# Patient Record
Sex: Male | Born: 1945 | Race: White | Hispanic: No | Marital: Married | Smoking: Former smoker
Health system: Southern US, Community
[De-identification: ages and names within clinical notes are randomized; demographics above are authoritative.]

## PROBLEM LIST (undated history)

## (undated) DIAGNOSIS — E11628 Type 2 diabetes mellitus with other skin complications: Secondary | ICD-10-CM

## (undated) DIAGNOSIS — I2510 Atherosclerotic heart disease of native coronary artery without angina pectoris: Secondary | ICD-10-CM

## (undated) DIAGNOSIS — M1990 Unspecified osteoarthritis, unspecified site: Secondary | ICD-10-CM

## (undated) DIAGNOSIS — R51 Headache: Secondary | ICD-10-CM

## (undated) DIAGNOSIS — J189 Pneumonia, unspecified organism: Secondary | ICD-10-CM

## (undated) DIAGNOSIS — L089 Local infection of the skin and subcutaneous tissue, unspecified: Secondary | ICD-10-CM

## (undated) DIAGNOSIS — E538 Deficiency of other specified B group vitamins: Secondary | ICD-10-CM

## (undated) DIAGNOSIS — E559 Vitamin D deficiency, unspecified: Secondary | ICD-10-CM

## (undated) DIAGNOSIS — Z972 Presence of dental prosthetic device (complete) (partial): Secondary | ICD-10-CM

## (undated) DIAGNOSIS — J302 Other seasonal allergic rhinitis: Secondary | ICD-10-CM

## (undated) DIAGNOSIS — I480 Paroxysmal atrial fibrillation: Secondary | ICD-10-CM

## (undated) DIAGNOSIS — E782 Mixed hyperlipidemia: Secondary | ICD-10-CM

## (undated) DIAGNOSIS — K219 Gastro-esophageal reflux disease without esophagitis: Secondary | ICD-10-CM

## (undated) DIAGNOSIS — N400 Enlarged prostate without lower urinary tract symptoms: Secondary | ICD-10-CM

## (undated) DIAGNOSIS — I10 Essential (primary) hypertension: Secondary | ICD-10-CM

## (undated) DIAGNOSIS — E785 Hyperlipidemia, unspecified: Secondary | ICD-10-CM

## (undated) DIAGNOSIS — M545 Low back pain, unspecified: Secondary | ICD-10-CM

## (undated) DIAGNOSIS — M5440 Lumbago with sciatica, unspecified side: Secondary | ICD-10-CM

## (undated) DIAGNOSIS — N1831 Chronic kidney disease, stage 3a: Secondary | ICD-10-CM

## (undated) DIAGNOSIS — I25118 Atherosclerotic heart disease of native coronary artery with other forms of angina pectoris: Secondary | ICD-10-CM

## (undated) DIAGNOSIS — R42 Dizziness and giddiness: Secondary | ICD-10-CM

## (undated) HISTORY — DX: Type 2 diabetes mellitus with other skin complications: L08.9

## (undated) HISTORY — DX: Lumbago with sciatica, unspecified side: M54.40

## (undated) HISTORY — DX: Atherosclerotic heart disease of native coronary artery without angina pectoris: I25.10

## (undated) HISTORY — PX: ABLATION: SHX5711

## (undated) HISTORY — DX: Type 2 diabetes mellitus with other skin complications: E11.628

## (undated) HISTORY — DX: Vitamin D deficiency, unspecified: E55.9

## (undated) HISTORY — DX: Local infection of the skin and subcutaneous tissue, unspecified: E11.628

## (undated) HISTORY — DX: Paroxysmal atrial fibrillation: I48.0

## (undated) HISTORY — DX: Deficiency of other specified B group vitamins: E53.8

## (undated) HISTORY — DX: Mixed hyperlipidemia: E78.2

## (undated) HISTORY — DX: Chronic kidney disease, stage 3a: N18.31

## (undated) HISTORY — DX: Gastro-esophageal reflux disease without esophagitis: K21.9

## (undated) HISTORY — DX: Essential (primary) hypertension: I10

## (undated) HISTORY — DX: Atherosclerotic heart disease of native coronary artery with other forms of angina pectoris: I25.118

## (undated) HISTORY — PX: COLONOSCOPY: SHX174

## (undated) HISTORY — PX: OTHER SURGICAL HISTORY: SHX169

## (undated) HISTORY — PX: HAND SURGERY: SHX662

---

## 2004-11-23 ENCOUNTER — Emergency Department: Payer: Self-pay | Admitting: Emergency Medicine

## 2004-11-24 ENCOUNTER — Emergency Department: Payer: Self-pay | Admitting: General Practice

## 2005-07-29 ENCOUNTER — Ambulatory Visit: Payer: Self-pay | Admitting: Family Medicine

## 2005-08-12 ENCOUNTER — Ambulatory Visit: Payer: Self-pay | Admitting: Family Medicine

## 2006-10-06 ENCOUNTER — Ambulatory Visit: Payer: Self-pay | Admitting: Family Medicine

## 2007-05-17 ENCOUNTER — Emergency Department: Payer: Self-pay | Admitting: Emergency Medicine

## 2007-07-24 ENCOUNTER — Emergency Department: Payer: Self-pay | Admitting: Emergency Medicine

## 2009-03-27 ENCOUNTER — Emergency Department (HOSPITAL_COMMUNITY): Admission: EM | Admit: 2009-03-27 | Discharge: 2009-03-27 | Payer: Self-pay | Admitting: Emergency Medicine

## 2009-09-19 ENCOUNTER — Emergency Department (HOSPITAL_COMMUNITY): Admission: EM | Admit: 2009-09-19 | Discharge: 2009-09-19 | Payer: Self-pay | Admitting: Emergency Medicine

## 2010-01-25 ENCOUNTER — Ambulatory Visit: Payer: Self-pay

## 2010-12-11 ENCOUNTER — Encounter: Payer: Self-pay | Admitting: Neurosurgery

## 2012-06-09 ENCOUNTER — Other Ambulatory Visit: Payer: Self-pay | Admitting: Neurosurgery

## 2012-06-09 ENCOUNTER — Other Ambulatory Visit (HOSPITAL_COMMUNITY): Payer: Self-pay | Admitting: Neurosurgery

## 2012-06-09 DIAGNOSIS — M545 Low back pain, unspecified: Secondary | ICD-10-CM

## 2012-06-09 DIAGNOSIS — M542 Cervicalgia: Secondary | ICD-10-CM

## 2012-07-02 ENCOUNTER — Ambulatory Visit (HOSPITAL_COMMUNITY)
Admission: RE | Admit: 2012-07-02 | Discharge: 2012-07-02 | Disposition: A | Discharge status: Home / Self Care | Payer: Medicaid Other | Source: Ambulatory Visit | Attending: Neurosurgery | Admitting: Neurosurgery

## 2012-07-02 DIAGNOSIS — M542 Cervicalgia: Secondary | ICD-10-CM

## 2012-07-02 DIAGNOSIS — R209 Unspecified disturbances of skin sensation: Secondary | ICD-10-CM | POA: Insufficient documentation

## 2012-07-02 DIAGNOSIS — M545 Low back pain, unspecified: Secondary | ICD-10-CM | POA: Insufficient documentation

## 2012-07-02 DIAGNOSIS — M79609 Pain in unspecified limb: Secondary | ICD-10-CM | POA: Insufficient documentation

## 2012-07-02 DIAGNOSIS — M4802 Spinal stenosis, cervical region: Secondary | ICD-10-CM | POA: Insufficient documentation

## 2012-07-02 DIAGNOSIS — M48061 Spinal stenosis, lumbar region without neurogenic claudication: Secondary | ICD-10-CM | POA: Insufficient documentation

## 2012-07-02 MED ORDER — OXYCODONE-ACETAMINOPHEN 5-325 MG PO TABS
ORAL_TABLET | ORAL | Status: AC
  Administered 2012-07-02: 1 via ORAL
  Filled 2012-07-02: qty 1

## 2012-07-02 MED ORDER — DIAZEPAM 5 MG PO TABS
ORAL_TABLET | ORAL | Status: AC
  Filled 2012-07-02: qty 2

## 2012-07-02 MED ORDER — IOHEXOL 300 MG/ML  SOLN
10.0000 mL | Freq: Once | INTRAMUSCULAR | Status: AC | PRN
  Administered 2012-07-02: 10 mL via INTRATHECAL

## 2012-07-02 MED ORDER — DIAZEPAM 5 MG PO TABS
10.0000 mg | ORAL_TABLET | Freq: Once | ORAL | Status: AC
  Administered 2012-07-02: 10 mg via ORAL

## 2012-07-02 MED ORDER — OXYCODONE-ACETAMINOPHEN 5-325 MG PO TABS
1.0000 | ORAL_TABLET | ORAL | Status: DC | PRN
  Administered 2012-07-02 (×2): 1 via ORAL
  Filled 2012-07-02: qty 1

## 2012-07-02 MED ORDER — ONDANSETRON HCL 4 MG/2ML IJ SOLN: 4.0000 mg | Freq: Four times a day (QID) | INTRAMUSCULAR | Status: DC | PRN

## 2012-07-02 NOTE — Procedures (Signed)
Lumbar Puncture Procedure Note  Indications: neck and bAck pain  Procedure Details   Consent: Informed consent was obtained. Risks of the procedure were discussed including: infection, bleeding, and pain.   Under sterile conditions the patient was positioned. Betadine solution and sterile drapes were utilized. Anesthesia used included lidocaine. A 20G spinal needle was inserted at the L3 - L4 interspace. A total of 1 attempt(s) were made. I infiltrated 10cc omnipaque 300 into the thecal sac. Fluoroscopy showed good filling of the lumbar thecal sac.  Complications:  None; patient tolerated the procedure well.        Condition: stable  Plan Pressure dressing. Close observation. CT Cervical and Lumbar spine

## 2012-12-15 ENCOUNTER — Other Ambulatory Visit: Payer: Self-pay | Admitting: Neurosurgery

## 2013-01-07 ENCOUNTER — Encounter (HOSPITAL_COMMUNITY)
Admission: RE | Admit: 2013-01-07 | Discharge: 2013-01-07 | Disposition: A | Discharge status: Home / Self Care | Payer: Medicaid Other | Source: Ambulatory Visit | Attending: Neurosurgery | Admitting: Neurosurgery

## 2013-01-07 ENCOUNTER — Encounter (HOSPITAL_COMMUNITY): Payer: Self-pay | Admitting: *Deleted

## 2013-01-07 NOTE — Progress Notes (Signed)
Pt doesn't have a cardiologist   Denies ever having an echo/stress test/heart cath  Medical Md @ [facility]  Denies EKG or CXR being done within past yr

## 2013-01-07 NOTE — Pre-Procedure Instructions (Signed)
LANDRUM CARBONELL  01/07/2013   Your procedure is scheduled on:  Wed, Mar 5 @ 8:30 AM  Report to [facility] at 6:30 AM.  Call this number if you have problems the morning of surgery: [phone]   Remember:   Do not eat food or drink liquids after midnight.   Take these medicines the morning of surgery with A SIP OF WATER: Flomax(Tamsulosin) and Tramadol(Ultram)               Stop taking your Aspirin,Etodolac,Fish Oil.No Ibuprofen,Goody's,BC's,or Aleve   Do not wear jewelry  Do not wear lotions, powders, or colognes. You may wear deodorant.  Men may shave face and neck.  Do not bring valuables to the hospital.  Contacts, dentures or bridgework may not be worn into surgery.  Leave suitcase in the car. After surgery it may be brought to your room.  For patients admitted to the hospital, checkout time is 11:00 AM the day of  discharge.   Patients discharged the day of surgery will not be allowed to drive  home.    Special Instructions: Shower using CHG 2 nights before surgery and the night before surgery.  If you shower the day of surgery use CHG.  Use special wash - you have one bottle of CHG for all showers.  You should use approximately 1/3 of the bottle for each shower.   Please read over the following fact sheets that you were given: Pain Booklet, Coughing and Deep Breathing, MRSA Information and Surgical Site Infection Prevention

## 2013-01-12 ENCOUNTER — Inpatient Hospital Stay (HOSPITAL_COMMUNITY): Admission: RE | Admit: 2013-01-12 | Payer: Medicaid Other | Source: Ambulatory Visit | Admitting: Neurosurgery

## 2013-01-12 HISTORY — DX: Hyperlipidemia, unspecified: E78.5

## 2013-01-12 HISTORY — DX: Benign prostatic hyperplasia without lower urinary tract symptoms: N40.0

## 2013-01-12 HISTORY — DX: Low back pain: M54.5

## 2013-01-12 HISTORY — DX: Low back pain, unspecified: M54.50

## 2013-01-12 HISTORY — DX: Other seasonal allergic rhinitis: J30.2

## 2013-01-12 HISTORY — DX: Headache: R51

## 2013-01-12 HISTORY — DX: Pneumonia, unspecified organism: J18.9

## 2013-01-12 SURGERY — ANTERIOR CERVICAL CORPECTOMY
Anesthesia: General

## 2013-02-06 ENCOUNTER — Emergency Department: Payer: Self-pay | Admitting: Emergency Medicine

## 2013-02-06 LAB — CBC
HCT: 39.7 % — ABNORMAL LOW (ref 40.0–52.0)
MCH: 30.6 pg (ref 26.0–34.0)
MCV: 90 fL (ref 80–100)
WBC: 10.8 10*3/uL — ABNORMAL HIGH (ref 3.8–10.6)

## 2013-02-06 LAB — COMPREHENSIVE METABOLIC PANEL
Albumin: 3.9 g/dL (ref 3.4–5.0)
Anion Gap: 8 (ref 7–16)
Bilirubin,Total: 0.3 mg/dL (ref 0.2–1.0)
Creatinine: 1.66 mg/dL — ABNORMAL HIGH (ref 0.60–1.30)
Osmolality: 281 (ref 275–301)
SGOT(AST): 15 U/L (ref 15–37)

## 2013-02-06 LAB — TROPONIN I
Troponin-I: 0.07 ng/mL — ABNORMAL HIGH
Troponin-I: 0.04 ng/mL

## 2013-02-06 LAB — URINALYSIS, COMPLETE
Nitrite: NEGATIVE
RBC,UR: NONE SEEN /HPF (ref 0–5)
Specific Gravity: 1.012 (ref 1.003–1.030)
Squamous Epithelial: NONE SEEN
WBC UR: <1 /HPF (ref 0–5)

## 2013-02-06 LAB — LIPASE, BLOOD: Lipase: 613 U/L — ABNORMAL HIGH (ref 73–393)

## 2013-05-04 ENCOUNTER — Emergency Department: Payer: Self-pay | Admitting: Internal Medicine

## 2013-05-12 ENCOUNTER — Emergency Department: Payer: Self-pay

## 2013-11-16 ENCOUNTER — Emergency Department: Payer: Self-pay | Admitting: Emergency Medicine

## 2014-06-15 IMAGING — CT CT HEAD WITHOUT CONTRAST
3 of 4 series · 16 of 47 positions shown, 19 images · non-contrast
Comparison: None.

CLINICAL DATA: Head injury and visual changes.

EXAM:
CT HEAD WITHOUT CONTRAST
CT MAXILLOFACIAL WITHOUT CONTRAST
TECHNIQUE: Multidetector CT imaging of the head and maxillofacial structures
were performed using the standard protocol without intravenous
contrast. Multiplanar CT image reconstructions of the maxillofacial
structures were also generated.

[Series 3: max soft · axial · 0.36mm/px · z∈[+160,+306]mm · 10 of 87 slices shown, 13 images]
[im 9/87  brain]
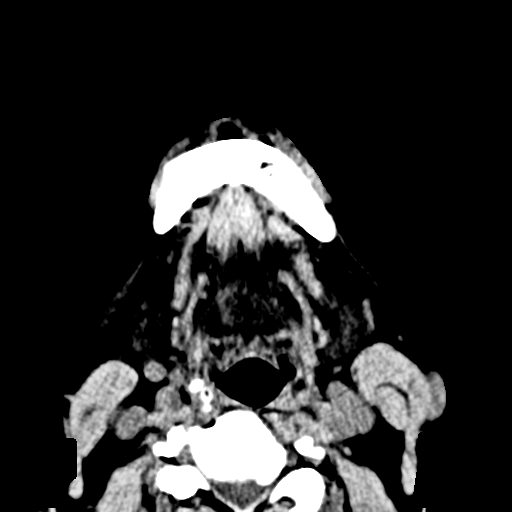
[im 9/87  bone]
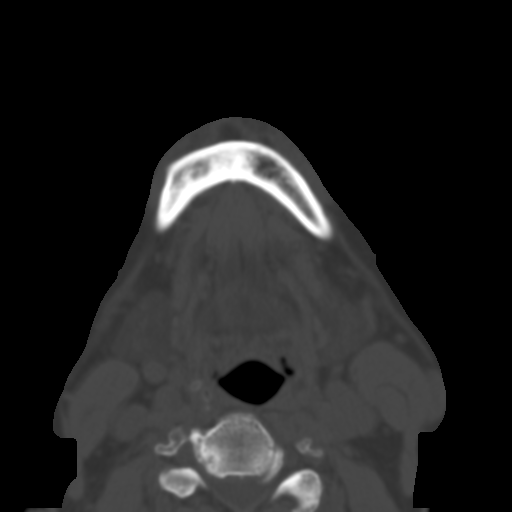
[im 17/87  brain]
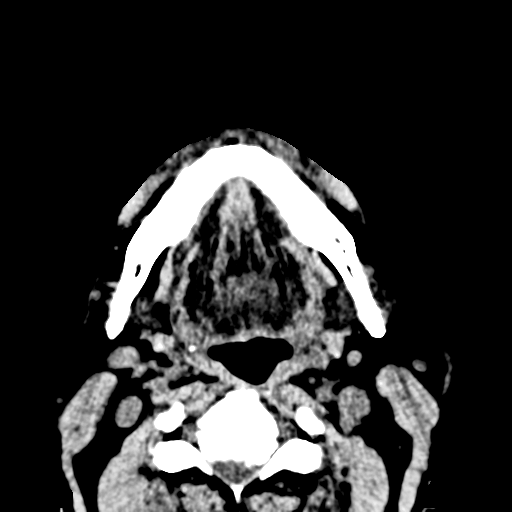
[im 25/87  brain]
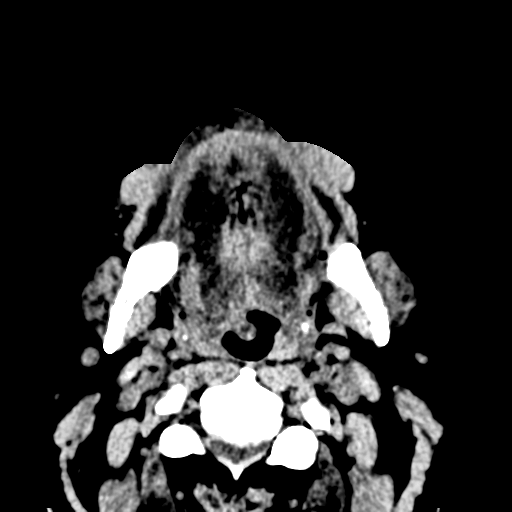
[im 33/87  brain]
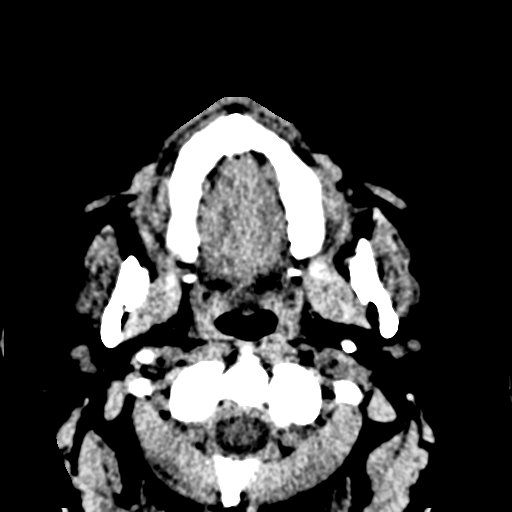
[im 41/87  brain]
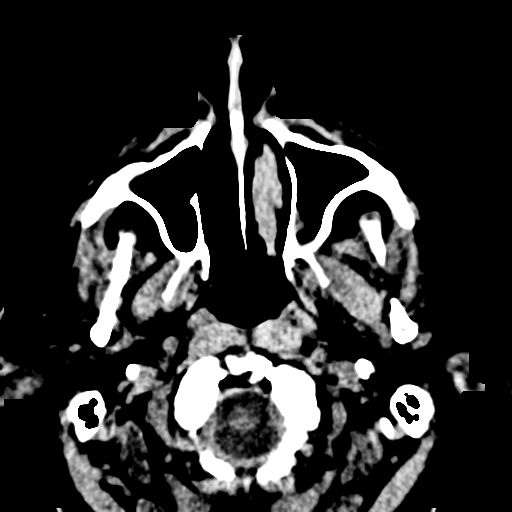
[im 41/87  bone]
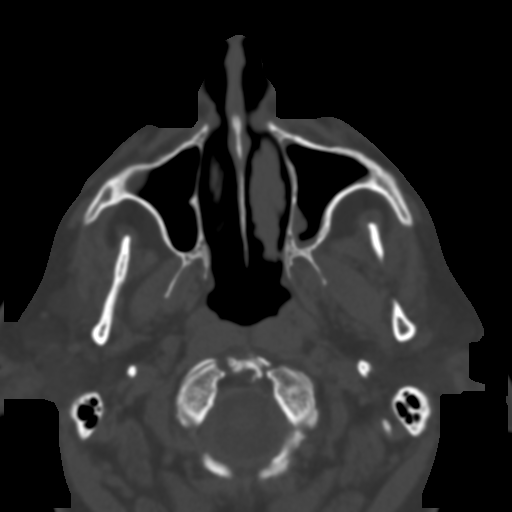
[im 50/87  brain]
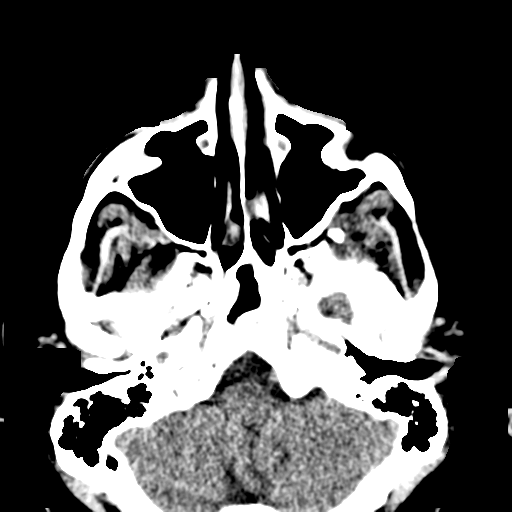
[im 58/87  brain]
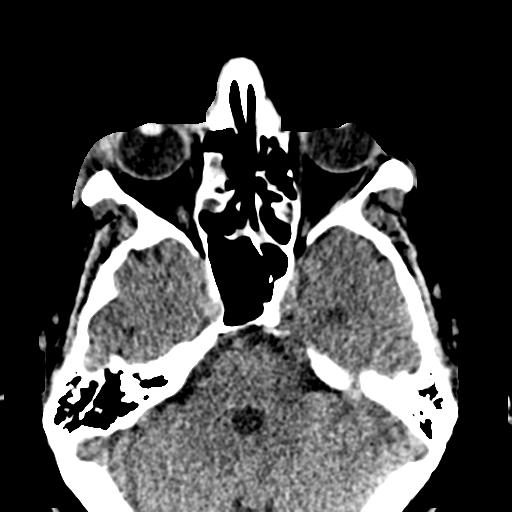
[im 66/87  brain]
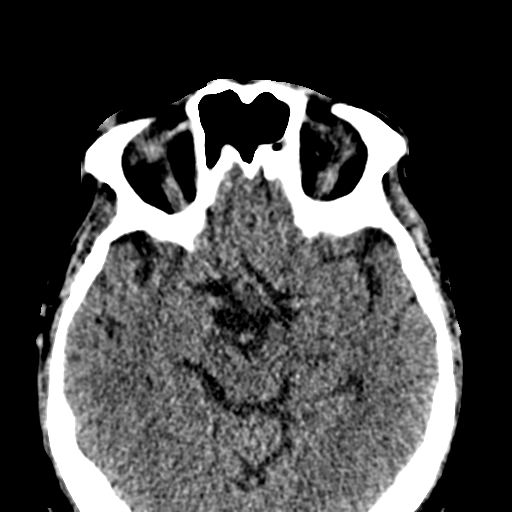
[im 74/87  brain]
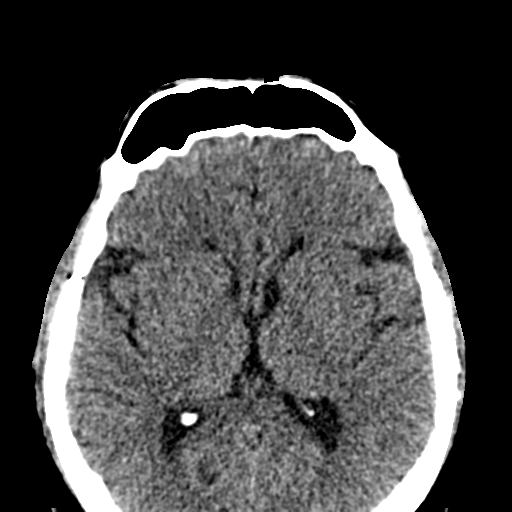
[im 74/87  bone]
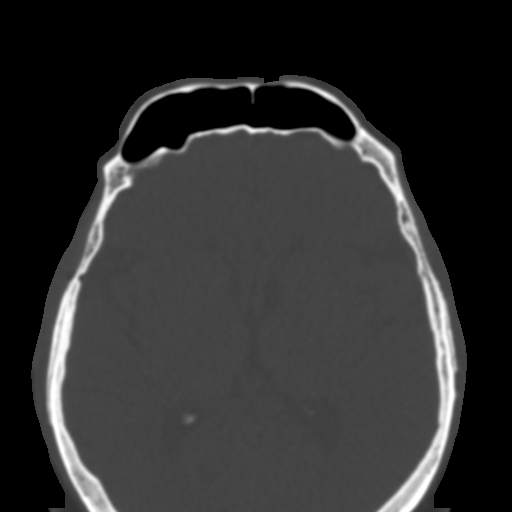
[im 82/87  brain]
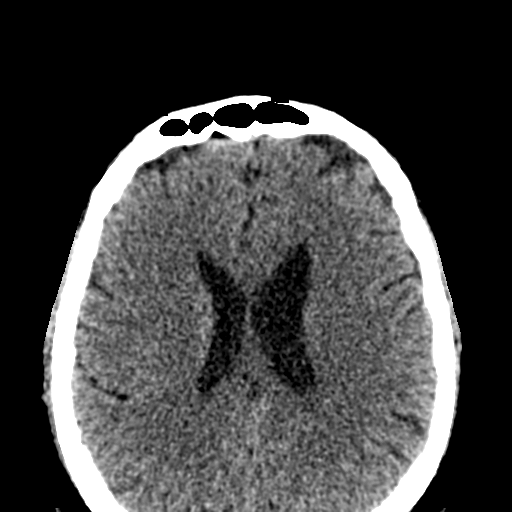

[Series 6: coronal soft · coronal · 0.37mm/px · 3 of 80 slices shown]
[im 27/80  brain]
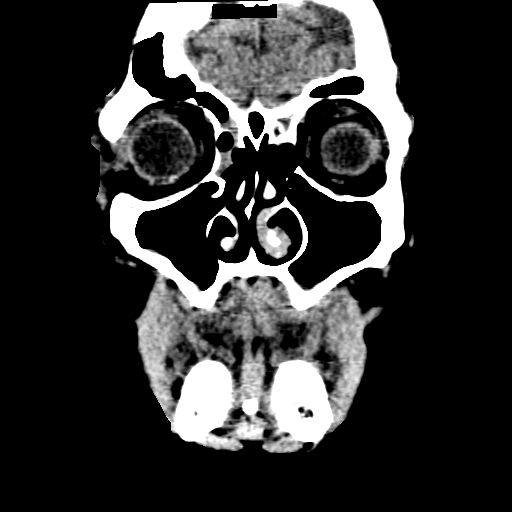
[im 36/80  brain]
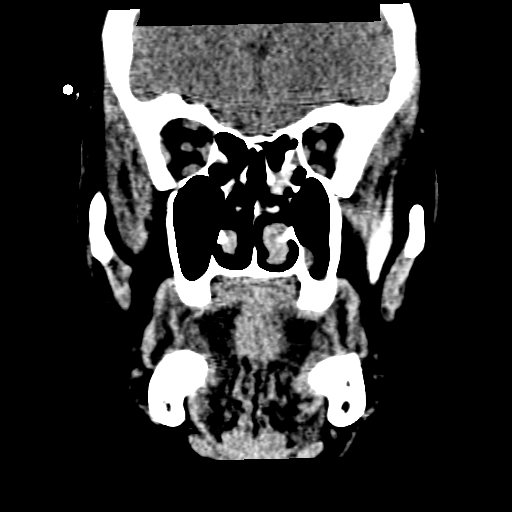
[im 44/80  brain]
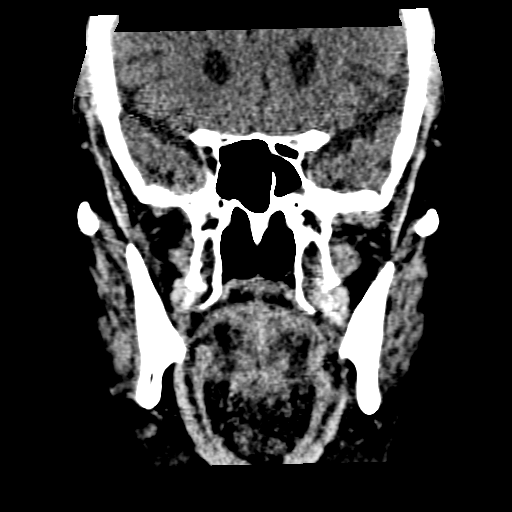

[Series 7: sagittal soft · sagittal · 0.37mm/px · 3 of 85 slices shown]
[im 29/85  brain]
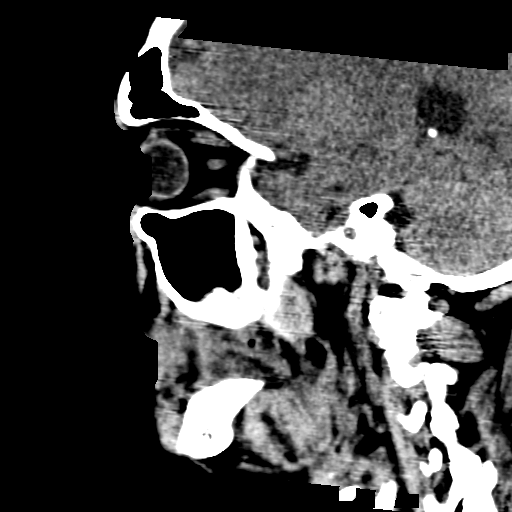
[im 43/85  brain]
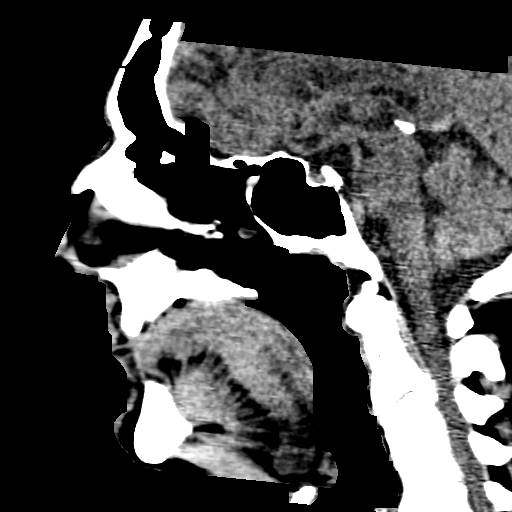
[im 57/85  brain]
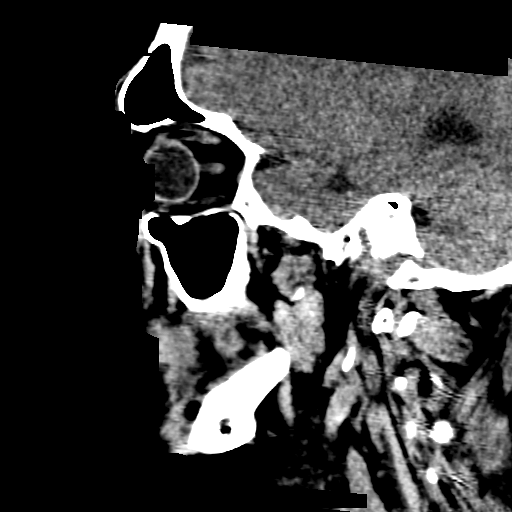

[16 of 47 positions shown; findings below may reference images not displayed]

FINDINGS: CT HEAD FINDINGS

No mass effect, midline shift, or acute intracranial hemorrhage.
Mild global atrophy. Mastoid air cells are clear. Partial
opacification of the ethmoid air cells. Marked soft tissue swelling
over the right orbit.

CT MAXILLOFACIAL FINDINGS

Marked soft tissue swelling over the right orbital bony framework.
No underlying fracture. Partial opacification of the ethmoid air
cells. Remainder of the paranasal sinuses are clear. Mastoid air
cells are clear. The patient is edentulous. No evidence of vitreous
or orbital hemorrhage. Limited images of the cervical spine
demonstrate degenerative changes as well as severe spinal stenosis
at C2-3.
IMPRESSION: No acute intracranial pathology

No evidence of facial bone injury.

Severe spinal stenosis at C2-3.

Soft tissue swelling over the right orbit without underlying
fracture.

## 2014-07-21 ENCOUNTER — Ambulatory Visit: Payer: Self-pay | Admitting: Family Medicine

## 2014-07-21 LAB — COMPREHENSIVE METABOLIC PANEL
ALK PHOS: 109 U/L
ANION GAP: 9 (ref 7–16)
AST: 31 U/L (ref 15–37)
Albumin: 4.2 g/dL (ref 3.4–5.0)
BILIRUBIN TOTAL: 0.6 mg/dL (ref 0.2–1.0)
BUN: 23 mg/dL — AB (ref 7–18)
CALCIUM: 9.1 mg/dL (ref 8.5–10.1)
CHLORIDE: 101 mmol/L (ref 98–107)
CREATININE: 1.49 mg/dL — AB (ref 0.60–1.30)
Co2: 31 mmol/L (ref 21–32)
EGFR (African American): 56 — ABNORMAL LOW
EGFR (Non-African Amer.): 48 — ABNORMAL LOW
Glucose: 82 mg/dL (ref 65–99)
Osmolality: 284 (ref 275–301)
POTASSIUM: 4.1 mmol/L (ref 3.5–5.1)
SGPT (ALT): 43 U/L
Sodium: 141 mmol/L (ref 136–145)
TOTAL PROTEIN: 7.6 g/dL (ref 6.4–8.2)

## 2014-07-21 LAB — CBC WITH DIFFERENTIAL/PLATELET
Basophil #: 0 10*3/uL (ref 0.0–0.1)
Basophil %: 0.3 %
EOS PCT: 3.8 %
Eosinophil #: 0.4 10*3/uL (ref 0.0–0.7)
HCT: 42.8 % (ref 40.0–52.0)
HGB: 13.9 g/dL (ref 13.0–18.0)
Lymphocyte #: 0.8 10*3/uL — ABNORMAL LOW (ref 1.0–3.6)
Lymphocyte %: 7.1 %
MCH: 29.3 pg (ref 26.0–34.0)
MCHC: 32.5 g/dL (ref 32.0–36.0)
MCV: 90 fL (ref 80–100)
MONOS PCT: 1.7 %
Monocyte #: 0.2 x10 3/mm (ref 0.2–1.0)
NEUTROS PCT: 87.1 %
Neutrophil #: 9.9 10*3/uL — ABNORMAL HIGH (ref 1.4–6.5)
Platelet: 156 10*3/uL (ref 150–440)
RBC: 4.74 10*6/uL (ref 4.40–5.90)
RDW: 12.5 % (ref 11.5–14.5)
WBC: 11.4 10*3/uL — ABNORMAL HIGH (ref 3.8–10.6)

## 2014-07-21 LAB — URINALYSIS, COMPLETE
BILIRUBIN, UR: NEGATIVE
BLOOD: NEGATIVE
Bacteria: NEGATIVE
Glucose,UR: NEGATIVE
Ketone: NEGATIVE
Leukocyte Esterase: NEGATIVE
Nitrite: NEGATIVE
PH: 5.5 (ref 5.0–8.0)
PROTEIN: NEGATIVE
Specific Gravity: 1.025 (ref 1.000–1.030)
Squamous Epithelial: NONE SEEN

## 2015-02-14 DIAGNOSIS — E782 Mixed hyperlipidemia: Secondary | ICD-10-CM | POA: Insufficient documentation

## 2015-02-17 IMAGING — CR DG CHEST 2V
2 series · 2 of 2 positions shown · non-contrast
Comparison: 02/06/2013.

CLINICAL DATA: Chills.

EXAM:
CHEST  2 VIEW

[chest pa]
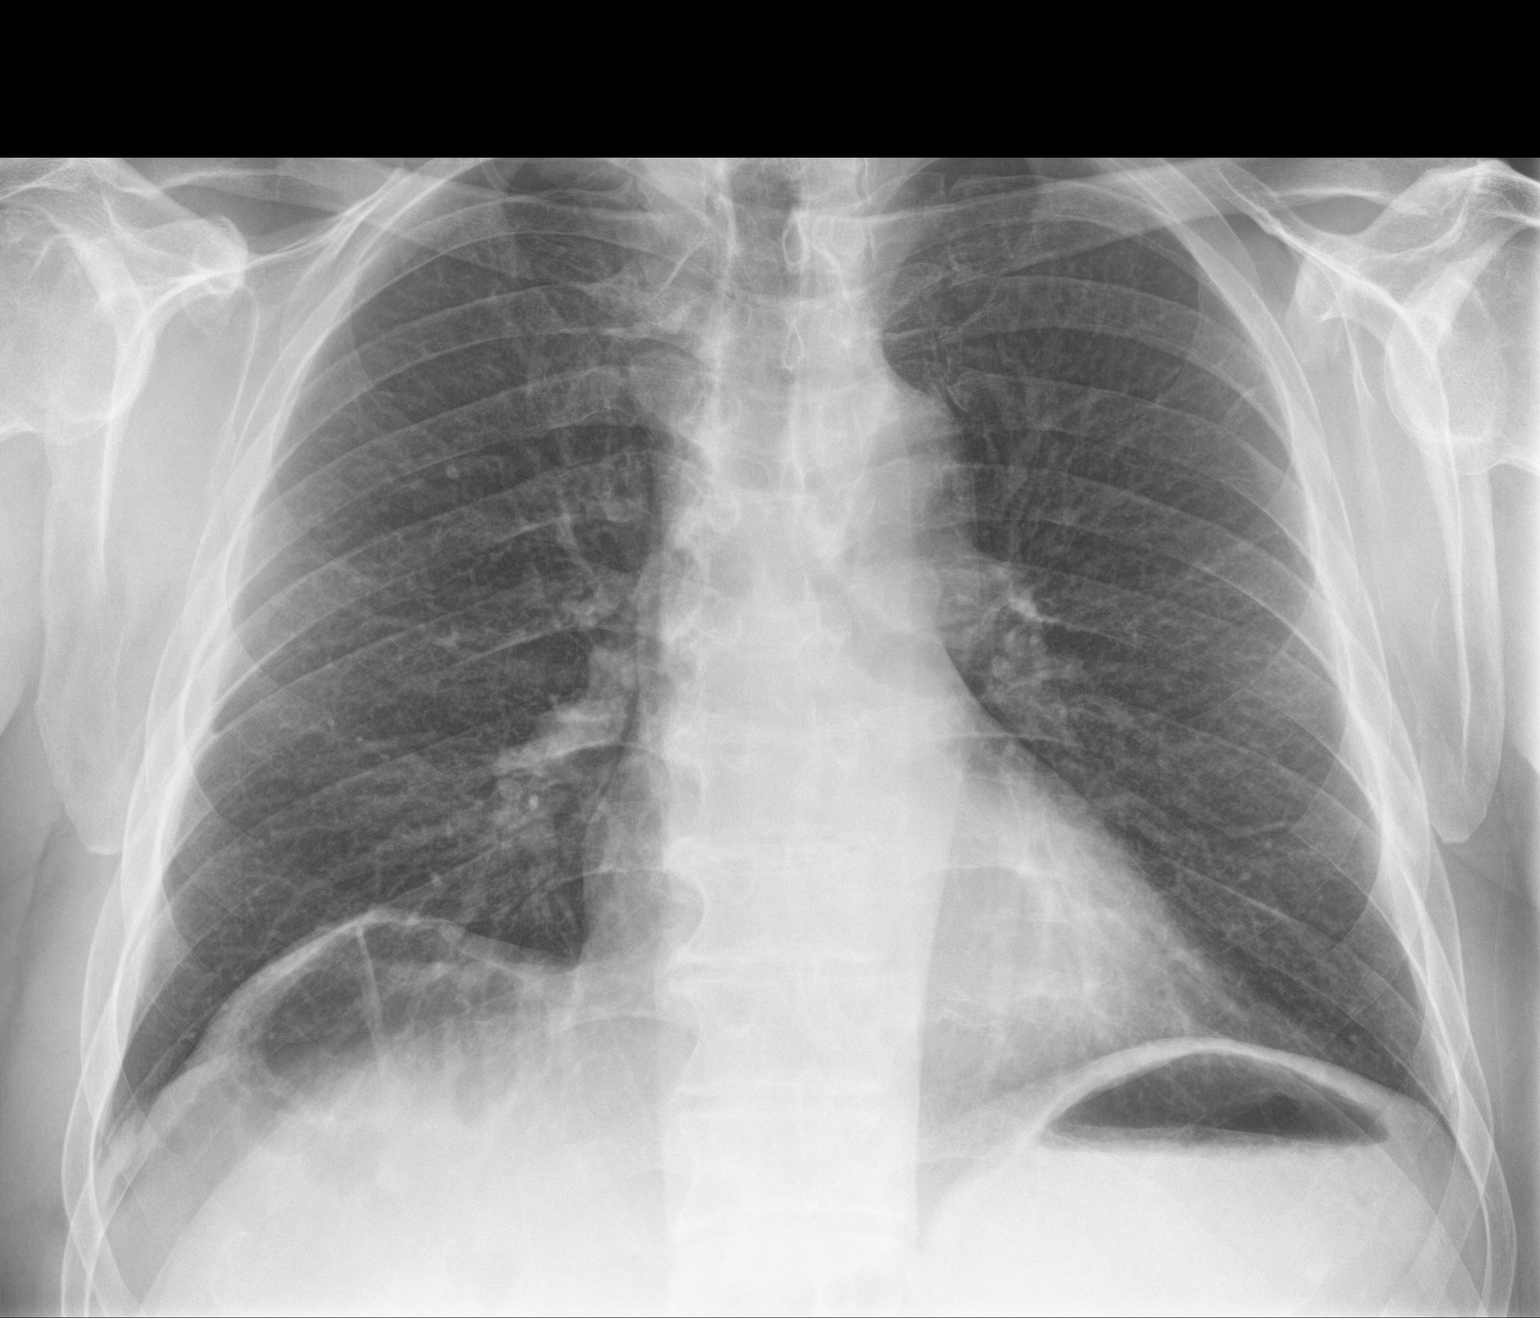

[chest lat]
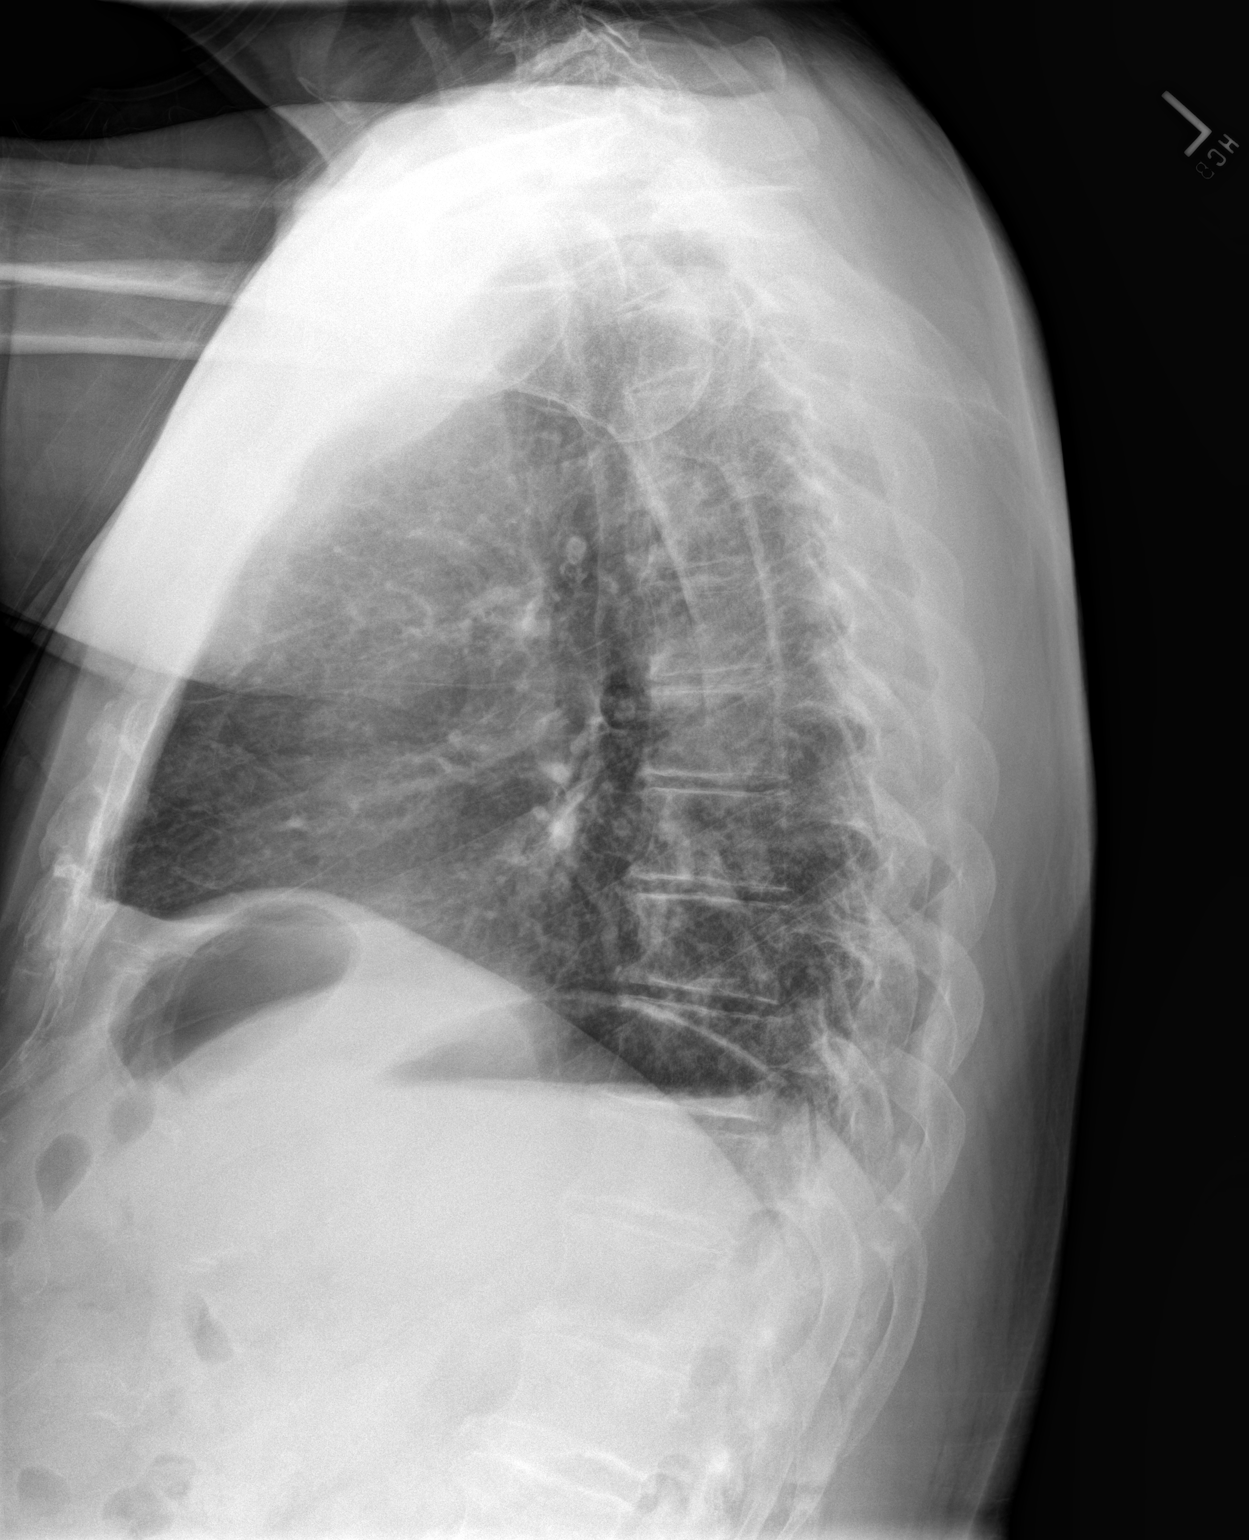

[2 of 2 positions shown; findings below may reference images not displayed]

FINDINGS: Mediastinum and hilar structures are normal. Heart size stable.
Pulmonary vascularity normal. Mild interstitial prominence noted.
Mild pneumonitis cannot be excluded. No pleural effusion or
pneumothorax. Interposition of the colon under the right
hemidiaphragm again noted. No acute bony abnormality. Degenerative
changes thoracic spine.
IMPRESSION: Mild diffuse interstitial prominence, mild pneumonitis cannot be
excluded.

## 2016-01-09 DIAGNOSIS — H8110 Benign paroxysmal vertigo, unspecified ear: Secondary | ICD-10-CM | POA: Insufficient documentation

## 2016-03-12 ENCOUNTER — Encounter: Payer: Self-pay | Admitting: *Deleted

## 2016-03-13 ENCOUNTER — Encounter: Payer: Self-pay | Admitting: Anesthesiology

## 2016-03-13 NOTE — Discharge Instructions (Signed)

## 2016-03-17 ENCOUNTER — Ambulatory Visit: Admission: RE | Admit: 2016-03-17 | Payer: Medicaid Other | Source: Ambulatory Visit | Admitting: Ophthalmology

## 2016-03-17 HISTORY — DX: Presence of dental prosthetic device (complete) (partial): Z97.2

## 2016-03-17 HISTORY — DX: Unspecified osteoarthritis, unspecified site: M19.90

## 2016-03-17 HISTORY — DX: Dizziness and giddiness: R42

## 2016-03-17 SURGERY — PHACOEMULSIFICATION, CATARACT, WITH IOL INSERTION
Anesthesia: Topical | Laterality: Left

## 2016-04-08 ENCOUNTER — Encounter: Payer: Self-pay | Admitting: *Deleted

## 2016-04-10 NOTE — Discharge Instructions (Signed)

## 2016-04-14 ENCOUNTER — Ambulatory Visit
Admission: RE | Admit: 2016-04-14 | Discharge: 2016-04-14 | Disposition: A | Discharge status: Home / Self Care | Payer: Medicare Other | Source: Ambulatory Visit | Attending: Ophthalmology | Admitting: Ophthalmology

## 2016-04-14 ENCOUNTER — Ambulatory Visit: Payer: Medicare Other | Admitting: Anesthesiology

## 2016-04-14 ENCOUNTER — Encounter
Admission: RE | Disposition: A | Discharge status: Home / Self Care | Payer: Self-pay | Source: Ambulatory Visit | Attending: Ophthalmology

## 2016-04-14 DIAGNOSIS — Z87891 Personal history of nicotine dependence: Secondary | ICD-10-CM | POA: Diagnosis not present

## 2016-04-14 DIAGNOSIS — H2512 Age-related nuclear cataract, left eye: Secondary | ICD-10-CM | POA: Diagnosis not present

## 2016-04-14 DIAGNOSIS — H52202 Unspecified astigmatism, left eye: Secondary | ICD-10-CM | POA: Diagnosis present

## 2016-04-14 HISTORY — PX: CATARACT EXTRACTION W/PHACO: SHX586

## 2016-04-14 SURGERY — PHACOEMULSIFICATION, CATARACT, WITH IOL INSERTION
Anesthesia: Monitor Anesthesia Care | Site: Eye | Laterality: Left

## 2016-04-14 MED ORDER — MIDAZOLAM HCL 2 MG/2ML IJ SOLN
INTRAMUSCULAR | Status: DC | PRN
  Administered 2016-04-14 (×2): 1 mg via INTRAVENOUS

## 2016-04-14 MED ORDER — TETRACAINE HCL 0.5 % OP SOLN
1.0000 [drp] | OPHTHALMIC | Status: DC | PRN
  Administered 2016-04-14: 1 [drp] via OPHTHALMIC

## 2016-04-14 MED ORDER — ACETAMINOPHEN 160 MG/5ML PO SOLN: 325.0000 mg | ORAL | Status: DC | PRN

## 2016-04-14 MED ORDER — LACTATED RINGERS IV SOLN: INTRAVENOUS | Status: DC

## 2016-04-14 MED ORDER — ACETAMINOPHEN 325 MG PO TABS: 325.0000 mg | ORAL_TABLET | ORAL | Status: DC | PRN

## 2016-04-14 MED ORDER — NA HYALUR & NA CHOND-NA HYALUR 0.4-0.35 ML IO KIT
PACK | INTRAOCULAR | Status: DC | PRN
  Administered 2016-04-14: 1 mL via INTRAOCULAR

## 2016-04-14 MED ORDER — TIMOLOL MALEATE 0.5 % OP SOLN
OPHTHALMIC | Status: DC | PRN
  Administered 2016-04-14: 1 [drp] via OPHTHALMIC

## 2016-04-14 MED ORDER — ARMC OPHTHALMIC DILATING GEL
1.0000 "application " | OPHTHALMIC | Status: DC | PRN
  Administered 2016-04-14 (×2): 1 via OPHTHALMIC

## 2016-04-14 MED ORDER — CEFUROXIME OPHTHALMIC INJECTION 1 MG/0.1 ML
INJECTION | OPHTHALMIC | Status: DC | PRN
  Administered 2016-04-14: 0.1 mL via OPHTHALMIC

## 2016-04-14 MED ORDER — EPINEPHRINE HCL 1 MG/ML IJ SOLN
INTRAMUSCULAR | Status: DC | PRN
  Administered 2016-04-14: 127 mL via OPHTHALMIC

## 2016-04-14 MED ORDER — LIDOCAINE HCL (PF) 4 % IJ SOLN
INTRAOCULAR | Status: DC | PRN
  Administered 2016-04-14: 1 mL via OPHTHALMIC

## 2016-04-14 MED ORDER — BRIMONIDINE TARTRATE 0.2 % OP SOLN
OPHTHALMIC | Status: DC | PRN
  Administered 2016-04-14: 1 [drp] via OPHTHALMIC

## 2016-04-14 MED ORDER — FENTANYL CITRATE (PF) 100 MCG/2ML IJ SOLN
INTRAMUSCULAR | Status: DC | PRN
  Administered 2016-04-14 (×2): 50 ug via INTRAVENOUS

## 2016-04-14 MED ORDER — POVIDONE-IODINE 5 % OP SOLN
1.0000 "application " | OPHTHALMIC | Status: DC | PRN
  Administered 2016-04-14: 1 via OPHTHALMIC

## 2016-04-14 SURGICAL SUPPLY — 28 items
APPLICATOR COTTON TIP 3IN (MISCELLANEOUS) ×3 IMPLANT
CANNULA ANT/CHMB 27GA (MISCELLANEOUS) ×3 IMPLANT
DISSECTOR HYDRO NUCLEUS 50X22 (MISCELLANEOUS) ×3 IMPLANT
GLOVE BIO SURGEON STRL SZ7 (GLOVE) ×3 IMPLANT
GLOVE SURG LX 6.5 MICRO (GLOVE) ×2
GLOVE SURG LX STRL 6.5 MICRO (GLOVE) ×1 IMPLANT
GOWN STRL REUS W/ TWL LRG LVL3 (GOWN DISPOSABLE) ×2 IMPLANT
GOWN STRL REUS W/TWL LRG LVL3 (GOWN DISPOSABLE) ×4
LENS IOL ACRYSOF IQ 21.5 (Intraocular Lens) ×3 IMPLANT
MARKER SKIN DUAL TIP RULER LAB (MISCELLANEOUS) ×3 IMPLANT
NEEDLE FILTER BLUNT 18X 1/2SAF (NEEDLE) ×4
NEEDLE FILTER BLUNT 18X1 1/2 (NEEDLE) ×2 IMPLANT
PACK CATARACT BRASINGTON (MISCELLANEOUS) ×3 IMPLANT
PACK EYE AFTER SURG (MISCELLANEOUS) ×3 IMPLANT
PACK OPTHALMIC (MISCELLANEOUS) ×3 IMPLANT
RING MALYGIN 7.0 (MISCELLANEOUS) IMPLANT
SOL BAL SALT 15ML (MISCELLANEOUS)
SOLUTION BAL SALT 15ML (MISCELLANEOUS) IMPLANT
SUT ETHILON 10-0 CS-B-6CS-B-6 (SUTURE)
SUT VICRYL  9 0 (SUTURE)
SUT VICRYL 9 0 (SUTURE) IMPLANT
SUTURE EHLN 10-0 CS-B-6CS-B-6 (SUTURE) IMPLANT
SYR 3ML LL SCALE MARK (SYRINGE) ×6 IMPLANT
SYR TB 1ML LUER SLIP (SYRINGE) ×3 IMPLANT
WATER STERILE IRR 250ML POUR (IV SOLUTION) ×3 IMPLANT
WATER STERILE IRR 500ML POUR (IV SOLUTION) IMPLANT
WICK EYE OCUCEL (MISCELLANEOUS) IMPLANT
WIPE NON LINTING 3.25X3.25 (MISCELLANEOUS) ×3 IMPLANT

## 2016-04-14 NOTE — Anesthesia Procedure Notes (Signed)
Procedure Name: MAC Date/Time: 04/14/2016 9:47 AM Performed by: Maree KrabbeWARREN, Travaris Kosh Pre-anesthesia Checklist: Patient identified, Emergency Drugs available, Suction available, Timeout performed and Patient being monitored Patient Re-evaluated:Patient Re-evaluated prior to inductionOxygen Delivery Method: Nasal cannula Placement Confirmation: positive ETCO2

## 2016-04-14 NOTE — Transfer of Care (Signed)
Immediate Anesthesia Transfer of Care Note  Patient: Robert SlimKenneth T Wisecup Sr.  Procedure(s) Performed: Procedure(s): CATARACT EXTRACTION PHACO AND INTRAOCULAR LENS PLACEMENT (IOC) (Left)  Patient Location: PACU  Anesthesia Type: MAC  Level of Consciousness: awake, alert  and patient cooperative  Airway and Oxygen Therapy: Patient Spontanous Breathing and Patient connected to supplemental oxygen  Post-op Assessment: Post-op Vital signs reviewed, Patient's Cardiovascular Status Stable, Respiratory Function Stable, Patent Airway and No signs of Nausea or vomiting  Post-op Vital Signs: Reviewed and stable  Complications: No apparent anesthesia complications

## 2016-04-14 NOTE — Anesthesia Preprocedure Evaluation (Signed)
Anesthesia Evaluation  Patient identified by MRN, date of birth, ID band  Reviewed: Allergy & Precautions, H&P , NPO status , Patient's Chart, lab work & pertinent test results  Airway Mallampati: II  TM Distance: >3 FB Neck ROM: full    Dental  (+) Edentulous Upper, Edentulous Lower   Pulmonary former smoker,    Pulmonary exam normal        Cardiovascular  Rhythm:regular Rate:Normal     Neuro/Psych    GI/Hepatic   Endo/Other    Renal/GU      Musculoskeletal   Abdominal   Peds  Hematology   Anesthesia Other Findings   Reproductive/Obstetrics                             Anesthesia Physical Anesthesia Plan  ASA: II  Anesthesia Plan: MAC   Post-op Pain Management:    Induction:   Airway Management Planned:   Additional Equipment:   Intra-op Plan:   Post-operative Plan:   Informed Consent: I have reviewed the patients History and Physical, chart, labs and discussed the procedure including the risks, benefits and alternatives for the proposed anesthesia with the patient or authorized representative who has indicated his/her understanding and acceptance.     Plan Discussed with: CRNA  Anesthesia Plan Comments:         Anesthesia Quick Evaluation

## 2016-04-14 NOTE — H&P (Signed)
H+P reviewed and is up to date, please see paper chart.  

## 2016-04-14 NOTE — Anesthesia Postprocedure Evaluation (Signed)
Anesthesia Post Note  Patient: Robert SlimKenneth T Nevares Sr.  Procedure(s) Performed: Procedure(s) (LRB): CATARACT EXTRACTION PHACO AND INTRAOCULAR LENS PLACEMENT (IOC) (Left)  Patient location during evaluation: PACU Anesthesia Type: MAC Level of consciousness: awake and alert and oriented Pain management: satisfactory to patient Vital Signs Assessment: post-procedure vital signs reviewed and stable Respiratory status: spontaneous breathing, nonlabored ventilation and respiratory function stable Cardiovascular status: blood pressure returned to baseline and stable Postop Assessment: Adequate PO intake and No signs of nausea or vomiting Anesthetic complications: no    [name], [name]

## 2016-04-14 NOTE — Op Note (Signed)
Date of Surgery: 04/14/2016  PREOPERATIVE DIAGNOSES: Visually significant nuclear sclerotic cataract, left eye.  POSTOPERATIVE DIAGNOSES: Same  PROCEDURES PERFORMED: Cataract extraction with intraocular lens implant, left eye.  SURGEON: Devin GoingAnita P. Vin, M.D.  ANESTHESIA: MAC and topical  IMPLANTS:   Implant Name Type Inv. Item Serial No. Manufacturer Lot No. LRB No. Used  LENS IOL ACRYSOF IQ 21.5 - [id number] Intraocular Lens LENS IOL ACRYSOF IQ 21.5 [id number] ALCON   Left 1    COMPLICATIONS: None.  DESCRIPTION OF PROCEDURE: Therapeutic options were discussed with the patient preoperatively, including a discussion of risks and benefits of surgery. Informed consent was obtained. An IOL-Master and immersion biometry were used to take the lens measurements, and a dilated fundus exam was performed within 6 months of the surgical date.  The patient was premedicated and brought to the operating room and placed on the operating table in the supine position. After adequate anesthesia, the patient was prepped and draped in the usual sterile ophthalmic fashion. A wire lid speculum was inserted and the microscope was positioned. A Superblade was used to create a paracentesis site at the limbus and a small amount of dilute preservative free lidocaine was instilled into the anterior chamber, followed by dispersive viscoelastic. A clear corneal incision was created temporally using a 2.4 mm keratome blade. Capsulorrhexis was then performed. In situ phacoemulsification was performed.  Cortical material was removed with the irrigation-aspiration unit. Dispersive viscoelastic was instilled to open the capsular bag. A posterior chamber intraocular lens with the specifications above was inserted and positioned. Irrigation-aspiration was used to remove all viscoelastic. Cefuroxime 1cc was instilled into the anterior chamber, and the corneal incision was checked and found to be water tight. The eyelid speculum  was removed.  The operative eye was covered with protective goggles after instilling 1 drop of timolol and brimonidine. The patient tolerated the procedure well. There were no complications.

## 2016-04-15 ENCOUNTER — Encounter: Payer: Self-pay | Admitting: Ophthalmology

## 2021-06-07 DIAGNOSIS — D649 Anemia, unspecified: Secondary | ICD-10-CM | POA: Insufficient documentation

## 2021-06-07 DIAGNOSIS — E559 Vitamin D deficiency, unspecified: Secondary | ICD-10-CM | POA: Insufficient documentation

## 2021-06-07 DIAGNOSIS — I4510 Unspecified right bundle-branch block: Secondary | ICD-10-CM | POA: Insufficient documentation

## 2021-06-07 DIAGNOSIS — M4712 Other spondylosis with myelopathy, cervical region: Secondary | ICD-10-CM | POA: Insufficient documentation

## 2021-06-07 DIAGNOSIS — I10 Essential (primary) hypertension: Secondary | ICD-10-CM | POA: Insufficient documentation

## 2021-06-07 DIAGNOSIS — E538 Deficiency of other specified B group vitamins: Secondary | ICD-10-CM | POA: Insufficient documentation

## 2021-06-07 DIAGNOSIS — L821 Other seborrheic keratosis: Secondary | ICD-10-CM | POA: Insufficient documentation

## 2021-06-07 DIAGNOSIS — I4892 Unspecified atrial flutter: Secondary | ICD-10-CM | POA: Insufficient documentation

## 2021-06-07 DIAGNOSIS — I82612 Acute embolism and thrombosis of superficial veins of left upper extremity: Secondary | ICD-10-CM | POA: Insufficient documentation

## 2021-10-24 LAB — COLOGUARD

## 2021-11-10 HISTORY — PX: CARDIAC CATHETERIZATION: SHX172

## 2022-01-29 DIAGNOSIS — N1831 Chronic kidney disease, stage 3a: Secondary | ICD-10-CM | POA: Insufficient documentation

## 2022-02-20 LAB — COLOGUARD

## 2022-03-15 LAB — COLOGUARD

## 2022-04-10 HISTORY — PX: CORONARY ANGIOPLASTY WITH STENT PLACEMENT: SHX49

## 2022-05-06 DIAGNOSIS — Z9889 Other specified postprocedural states: Secondary | ICD-10-CM | POA: Insufficient documentation

## 2022-08-27 LAB — COLOGUARD: COLOGUARD: NEGATIVE

## 2022-11-02 DIAGNOSIS — Z955 Presence of coronary angioplasty implant and graft: Secondary | ICD-10-CM | POA: Insufficient documentation

## 2023-06-30 ENCOUNTER — Ambulatory Visit: Payer: 59 | Admitting: Family

## 2023-07-16 DIAGNOSIS — I509 Heart failure, unspecified: Secondary | ICD-10-CM | POA: Insufficient documentation

## 2023-07-16 DIAGNOSIS — E119 Type 2 diabetes mellitus without complications: Secondary | ICD-10-CM | POA: Insufficient documentation

## 2023-07-16 DIAGNOSIS — I2510 Atherosclerotic heart disease of native coronary artery without angina pectoris: Secondary | ICD-10-CM | POA: Insufficient documentation

## 2023-07-27 ENCOUNTER — Ambulatory Visit: Payer: 59 | Attending: Cardiology | Admitting: Cardiology

## 2023-07-30 ENCOUNTER — Ambulatory Visit (INDEPENDENT_AMBULATORY_CARE_PROVIDER_SITE_OTHER): Payer: 59 | Admitting: Nurse Practitioner

## 2023-07-30 ENCOUNTER — Encounter: Payer: Self-pay | Admitting: Nurse Practitioner

## 2023-07-30 VITALS — BP 124/72 | HR 63 | Temp 98.0°F | Resp 16 | Ht 68.0 in | Wt 170.4 lb

## 2023-07-30 DIAGNOSIS — Z79899 Other long term (current) drug therapy: Secondary | ICD-10-CM

## 2023-07-30 DIAGNOSIS — E1122 Type 2 diabetes mellitus with diabetic chronic kidney disease: Secondary | ICD-10-CM | POA: Diagnosis not present

## 2023-07-30 DIAGNOSIS — I509 Heart failure, unspecified: Secondary | ICD-10-CM | POA: Diagnosis not present

## 2023-07-30 DIAGNOSIS — I4892 Unspecified atrial flutter: Secondary | ICD-10-CM | POA: Diagnosis not present

## 2023-07-30 DIAGNOSIS — I2510 Atherosclerotic heart disease of native coronary artery without angina pectoris: Secondary | ICD-10-CM

## 2023-07-30 DIAGNOSIS — I2583 Coronary atherosclerosis due to lipid rich plaque: Secondary | ICD-10-CM

## 2023-07-30 DIAGNOSIS — I129 Hypertensive chronic kidney disease with stage 1 through stage 4 chronic kidney disease, or unspecified chronic kidney disease: Secondary | ICD-10-CM

## 2023-07-30 DIAGNOSIS — N1831 Chronic kidney disease, stage 3a: Secondary | ICD-10-CM | POA: Diagnosis not present

## 2023-07-30 DIAGNOSIS — Z125 Encounter for screening for malignant neoplasm of prostate: Secondary | ICD-10-CM

## 2023-07-30 DIAGNOSIS — E538 Deficiency of other specified B group vitamins: Secondary | ICD-10-CM | POA: Diagnosis not present

## 2023-07-30 DIAGNOSIS — I10 Essential (primary) hypertension: Secondary | ICD-10-CM

## 2023-07-30 LAB — POCT GLYCOSYLATED HEMOGLOBIN (HGB A1C): Hemoglobin A1C: 6.6 % — AB (ref 4.0–5.6)

## 2023-07-30 NOTE — Progress Notes (Signed)
[facility] [address] [place], [place] [address]  Internal MEDICINE  Office Visit Note  Patient Name: Robert Garcia  [id number]  [id number]  Date of Service: 07/30/2023   Complaints/HPI Pt is here for establishment of PCP. Chief Complaint  Patient presents with   New Patient (Initial Visit)    diabetic    HPI [name] presents for a new patient visit to establish care.  Well-appearing 77 y.o. male with diabetes, CAD, HF, stage 3a CKD, neuropathy of both legs, arthritis in back and paroxysmal atrial flutter Had neck surgery, knee surgery, had ablation and stent placement for paroxysmal atrial flutter.  Work: retired  Home: lives with wife  Diet: decreased appetite in general  Exercise: walking Tobacco use: none Alcohol use: none Illicit drug use: none Routine CRC screening: cologuard negative in October 2023 Eye exam and/or foot exam: wants to get diabetic shoes.  Labs: routine labs due  New or worsening pain: none  Needs cardiology referral related to heart failure, CAD and paroxysmal atrial flutter.  Recently moved here from [place], [place]. Has a lot of family in this area.    Current Medication: Outpatient Encounter Medications as of 07/30/2023  Medication Sig   amiodarone (PACERONE) 200 MG tablet Take 200 mg by mouth 2 (two) times daily.   ELIQUIS 5 MG TABS tablet Take 5 mg by mouth 2 (two) times daily.   fluticasone (FLONASE) 50 MCG/ACT nasal spray 2 sprays into each nostril two (2) times a day.   glucose blood (ACCU-CHEK AVIVA PLUS) test strip by Other route daily.   levocetirizine (XYZAL) 5 MG tablet Take 1 tablet by mouth every evening.   nitroGLYCERIN (NITROSTAT) 0.4 MG SL tablet PLACE 1 TAB UNDER TONGUE FOR CHEST PAIN EVERY FIVE MINUTES--MAX OF 3 DOSES THEN CALL 911 OR GO TO ER   sildenafil (REVATIO) 20 MG tablet Take by mouth.   aspirin 325 MG tablet Take 325 mg by mouth daily.   ASPIRIN-CAFFEINE PO Take by mouth as needed. Reported on  04/14/2016   atorvastatin (LIPITOR) 40 MG tablet Take 40 mg by mouth daily.   Cholecalciferol (VITAMIN D-3 PO) Take by mouth daily.   digoxin (LANOXIN) 0.125 MG tablet Take 125 mcg by mouth daily.   FARXIGA 10 MG TABS tablet Take 10 mg by mouth daily.   meclizine (ANTIVERT) 25 MG tablet Take 25 mg by mouth 3 (three) times daily as needed for dizziness.   ranolazine (RANEXA) 500 MG 12 hr tablet Take 500 mg by mouth 2 (two) times daily.   traMADol (ULTRAM) 50 MG tablet Take 50 mg by mouth every 6 (six) hours as needed. For pain   No facility-administered encounter medications on file as of 07/30/2023.    Surgical History: Past Surgical History:  Procedure Laterality Date   CATARACT EXTRACTION W/PHACO Left 04/14/2016   Procedure: CATARACT EXTRACTION PHACO AND INTRAOCULAR LENS PLACEMENT (IOC);  Surgeon: Sherald Hess, MD;  Location: [facility];  Service: Ophthalmology;  Laterality: Left;   COLONOSCOPY     HAND SURGERY Left    multiple teeth extracted      Medical History: Past Medical History:  Diagnosis Date   Arthritis    back, arms   Enlarged prostate    was on Flomax but not taking it   Headache(784.0)    left ear stopped up   Hyperlipidemia    takes Trilipix daily   Low back pain    Pneumonia    hx of;15+yrs ago  Seasonal allergies    Vertigo    last episode - last year (2016)   Wears dentures    has full upper and lower.  does NOT wear    Family History: History reviewed. No pertinent family history.  Social History   Socioeconomic History   Marital status: Married    Spouse name: Not on file   Number of children: Not on file   Years of education: Not on file   Highest education level: Not on file  Occupational History   Not on file  Tobacco Use   Smoking status: Former   Smokeless tobacco: Not on file   Tobacco comments:    quit smoking 58yrs ago  Substance and Sexual Activity   Alcohol use: No   Drug use: No   Sexual activity: Not  Currently  Other Topics Concern   Not on file  Social History Narrative   Not on file   Social Determinants of Health   Financial Resource Strain: Low Risk  (02/05/2023)   Received from [facility]   Overall Financial Resource Strain (CARDIA)    Difficulty of Paying Living Expenses: Not hard at all  Food Insecurity: No Food Insecurity (02/05/2023)   Received from [facility]   Hunger Vital Sign    Worried About Running Out of Food in the Last Year: Never true    Ran Out of Food in the Last Year: Never true  Transportation Needs: No Transportation Needs (02/05/2023)   Received from [facility]   PRAPARE - Transportation    Lack of Transportation (Medical): No    Lack of Transportation (Non-Medical): No  Physical Activity: Insufficiently Active (12/31/2022)   Received from [facility]   Exercise Vital Sign    Days of Exercise per Week: 2 days    Minutes of Exercise per Session: 30 min  Stress: No Stress Concern Present (12/31/2022)   Received from [facility] of Occupational Health - Occupational Stress Questionnaire    Feeling of Stress : Not at all  Social Connections: Socially Integrated (12/31/2022)   Received from [facility]   Social Connection and Isolation Panel [NHANES]    Frequency of Communication with Friends and Family: More than three times a week    Frequency of Social Gatherings with Friends and Family: More than three times a week    Attends Religious Services: More than 4 times per year    Active Member of [employer] or Organizations: Yes    Attends Engineer, structural: More than 4 times per year    Marital Status: Married  Catering manager Violence: Not on file     Review of Systems  Constitutional:  Positive for fatigue. Negative for chills and unexpected weight change.  HENT:  Negative for congestion, postnasal drip, rhinorrhea, sneezing and sore throat.   Eyes:  Negative for redness.  Respiratory:  Positive  for shortness of breath. Negative for cough, chest tightness and wheezing.   Cardiovascular:  Positive for palpitations (paroxysmal atrial flutter) and leg swelling. Negative for chest pain.  Gastrointestinal: Negative.  Negative for abdominal pain, constipation, diarrhea, nausea and vomiting.  Genitourinary:  Negative for dysuria and frequency.  Musculoskeletal:  Positive for arthralgias and back pain. Negative for joint swelling and neck pain.  Skin:  Negative for rash.  Neurological:  Positive for weakness. Negative for tremors and numbness.  Hematological:  Negative for adenopathy. Does not bruise/bleed easily.  Psychiatric/Behavioral:  Negative  for behavioral problems (Depression), sleep disturbance and suicidal ideas. The patient is not nervous/anxious.     Vital Signs: BP 124/72   Pulse 63   Temp 98 F (36.7 C)   Resp 16   Ht 5\' 8"  (1.727 m)   Wt 170 lb 6.4 oz (77.3 kg)   SpO2 98%   BMI 25.91 kg/m    Physical Exam Vitals reviewed.  Constitutional:      Appearance: Normal appearance.  HENT:     Head: Normocephalic and atraumatic.  Eyes:     Pupils: Pupils are equal, round, and reactive to light.  Cardiovascular:     Rate and Rhythm: Normal rate. Rhythm irregular.  Pulmonary:     Effort: Pulmonary effort is normal. No respiratory distress.  Neurological:     Mental Status: He is alert and oriented to person, place, and time.  Psychiatric:        Mood and Affect: Mood normal.        Behavior: Behavior normal.       Assessment/Plan: 1. Chronic heart failure, unspecified heart failure type [facility]) Referred to cardiology  - Ambulatory referral to Cardiology  2. Coronary artery disease due to lipid rich plaque Referred to cardiology  - Ambulatory referral to Cardiology  3. Paroxysmal atrial flutter (HCC) Routine labs ordered, referred to cardiology  - POCT glycosylated hemoglobin (Hb A1C) - CBC with Differential/Platelet - CMP14+EGFR - Lipid Profile - TSH +  free T4 - B12 and Folate Panel - Ambulatory referral to Cardiology  4. Type 2 diabetes mellitus with stage 3a chronic kidney disease, without long-term current use of insulin (HCC) Routine labs ordered  - POCT glycosylated hemoglobin (Hb A1C) - CBC with Differential/Platelet - CMP14+EGFR - Lipid Profile - TSH + free T4 - B12 and Folate Panel  5. Stage 3a chronic kidney disease (CKD) (HCC) Routine labs ordered  - POCT glycosylated hemoglobin (Hb A1C) - CBC with Differential/Platelet - CMP14+EGFR - Lipid Profile - TSH + free T4 - B12 and Folate Panel  6. Hypertension associated with stage 3a chronic kidney disease due to type 2 diabetes mellitus (HCC) Routine labs ordered  - POCT glycosylated hemoglobin (Hb A1C) - CBC with Differential/Platelet - CMP14+EGFR - Lipid Profile - TSH + free T4 - B12 and Folate Panel  7. B12 deficiency Routine labs ordered  - CBC with Differential/Platelet - B12 and Folate Panel  8. Screening for prostate cancer Lab ordered  - PSA Total (Reflex To Free)  9. Encounter for medication review Medication list reviewed and updated.  - amiodarone (PACERONE) 200 MG tablet; Take 200 mg by mouth 2 (two) times daily. - ELIQUIS 5 MG TABS tablet; Take 5 mg by mouth 2 (two) times daily. - FARXIGA 10 MG TABS tablet; Take 10 mg by mouth daily. - digoxin (LANOXIN) 0.125 MG tablet; Take 125 mcg by mouth daily. - fluticasone (FLONASE) 50 MCG/ACT nasal spray; 2 sprays into each nostril two (2) times a day. - levocetirizine (XYZAL) 5 MG tablet; Take 1 tablet by mouth every evening. - nitroGLYCERIN (NITROSTAT) 0.4 MG SL tablet; PLACE 1 TAB UNDER TONGUE FOR CHEST PAIN EVERY FIVE MINUTES--MAX OF 3 DOSES THEN CALL 911 OR GO TO ER - glucose blood (ACCU-CHEK AVIVA PLUS) test strip; by Other route daily. - ranolazine (RANEXA) 500 MG 12 hr tablet; Take 500 mg by mouth 2 (two) times daily. - sildenafil (REVATIO) 20 MG tablet; Take by mouth.   General Counseling:  overton samberg understanding of the findings of todays  visit and agrees with plan of treatment. I have discussed any further diagnostic evaluation that may be needed or ordered today. We also reviewed his medications today. he has been encouraged to call the office with any questions or concerns that should arise related to todays visit.    Orders Placed This Encounter  Procedures   CBC with Differential/Platelet   CMP14+EGFR   Lipid Profile   TSH + free T4   B12 and Folate Panel   PSA Total (Reflex To Free)   POCT glycosylated hemoglobin (Hb A1C)    No orders of the defined types were placed in this encounter.   Return in about 3 weeks (around 08/20/2023) for F/U, Labs, Samaj Wessells PCP and new med for diabetes. .  Time spent:30 Minutes Time spent with patient included reviewing progress notes, labs, imaging studies, and discussing plan for follow up.   [place] Controlled Substance Database was reviewed by me for overdose risk score (ORS)   This patient was seen by Sallyanne Kuster, FNP-C in collaboration with Dr. Beverely Risen as a part of collaborative care agreement.   Maxima Skelton R. Tedd Sias, MSN, FNP-C Internal Medicine

## 2023-07-31 ENCOUNTER — Telehealth: Payer: Self-pay | Admitting: Nurse Practitioner

## 2023-07-31 NOTE — Telephone Encounter (Signed)
Patient called after hours stating new diabetic rx hadn't been sent to [facility] pharmacy. After s/w AA, I called patient to let him know he is to just continue the farxiga and stop the metformin-Toni

## 2023-08-06 ENCOUNTER — Encounter: Payer: Self-pay | Admitting: Cardiovascular Disease

## 2023-08-06 ENCOUNTER — Ambulatory Visit (INDEPENDENT_AMBULATORY_CARE_PROVIDER_SITE_OTHER): Payer: 59 | Admitting: Cardiovascular Disease

## 2023-08-06 VITALS — BP 90/65 | HR 86 | Ht 68.0 in | Wt 170.2 lb

## 2023-08-06 DIAGNOSIS — Z8679 Personal history of other diseases of the circulatory system: Secondary | ICD-10-CM | POA: Diagnosis not present

## 2023-08-06 DIAGNOSIS — E1169 Type 2 diabetes mellitus with other specified complication: Secondary | ICD-10-CM | POA: Diagnosis not present

## 2023-08-06 DIAGNOSIS — I509 Heart failure, unspecified: Secondary | ICD-10-CM

## 2023-08-06 DIAGNOSIS — R9431 Abnormal electrocardiogram [ECG] [EKG]: Secondary | ICD-10-CM | POA: Diagnosis not present

## 2023-08-06 DIAGNOSIS — I25118 Atherosclerotic heart disease of native coronary artery with other forms of angina pectoris: Secondary | ICD-10-CM | POA: Diagnosis not present

## 2023-08-06 DIAGNOSIS — R0602 Shortness of breath: Secondary | ICD-10-CM

## 2023-08-06 DIAGNOSIS — I10 Essential (primary) hypertension: Secondary | ICD-10-CM

## 2023-08-06 DIAGNOSIS — Z794 Long term (current) use of insulin: Secondary | ICD-10-CM | POA: Diagnosis not present

## 2023-08-06 DIAGNOSIS — E782 Mixed hyperlipidemia: Secondary | ICD-10-CM | POA: Diagnosis not present

## 2023-08-06 DIAGNOSIS — I4892 Unspecified atrial flutter: Secondary | ICD-10-CM

## 2023-08-06 DIAGNOSIS — Z9889 Other specified postprocedural states: Secondary | ICD-10-CM | POA: Diagnosis not present

## 2023-08-06 NOTE — Progress Notes (Signed)
Cardiology Office Note   Date:  08/06/2023   ID:  Robert Salz., DOB July 11, 1946, MRN [id number]  PCP:  Sallyanne Kuster, NP  Cardiologist:  Adrian Blackwater, MD      History of Present Illness: Robert Banfill. is a 77 y.o. male who presents for No chief complaint on file.   Here to establish cardiologist, had ablation for atrial fibrillation in [place] . Had CHF, admitted to hospital 13 days.  Patient at this time feels occasional shortness of breath and fatigue.  No chest pain.  Shortness of Breath The current episode started more than 1 year ago. The problem has been waxing and waning. Associated symptoms include leg swelling. Pertinent negatives include no chest pain.      Past Medical History:  Diagnosis Date   Arthritis    back, arms   Enlarged prostate    was on Flomax but not taking it   Headache(784.0)    left ear stopped up   Hyperlipidemia    takes Trilipix daily   Low back pain    Pneumonia    hx of;15+yrs ago   Seasonal allergies    Vertigo    last episode - last year (2016)   Wears dentures    has full upper and lower.  does NOT wear     Past Surgical History:  Procedure Laterality Date   CATARACT EXTRACTION W/PHACO Left 04/14/2016   Procedure: CATARACT EXTRACTION PHACO AND INTRAOCULAR LENS PLACEMENT (IOC);  Surgeon: Sherald Hess, MD;  Location: [facility];  Service: Ophthalmology;  Laterality: Left;   COLONOSCOPY     HAND SURGERY Left    multiple teeth extracted       Current Outpatient Medications  Medication Sig Dispense Refill   amiodarone (PACERONE) 200 MG tablet Take 200 mg by mouth 2 (two) times daily.     aspirin 325 MG tablet Take 325 mg by mouth daily.     ASPIRIN-CAFFEINE PO Take by mouth as needed. Reported on 04/14/2016     atorvastatin (LIPITOR) 40 MG tablet Take 40 mg by mouth daily.     Cholecalciferol (VITAMIN D-3 PO) Take by mouth daily.     digoxin (LANOXIN) 0.125 MG tablet Take 125 mcg by  mouth daily.     ELIQUIS 5 MG TABS tablet Take 5 mg by mouth 2 (two) times daily.     FARXIGA 10 MG TABS tablet Take 10 mg by mouth daily.     fluticasone (FLONASE) 50 MCG/ACT nasal spray 2 sprays into each nostril two (2) times a day.     glucose blood (ACCU-CHEK AVIVA PLUS) test strip by Other route daily.     levocetirizine (XYZAL) 5 MG tablet Take 1 tablet by mouth every evening.     meclizine (ANTIVERT) 25 MG tablet Take 25 mg by mouth 3 (three) times daily as needed for dizziness.     nitroGLYCERIN (NITROSTAT) 0.4 MG SL tablet PLACE 1 TAB UNDER TONGUE FOR CHEST PAIN EVERY FIVE MINUTES--MAX OF 3 DOSES THEN CALL 911 OR GO TO ER     ranolazine (RANEXA) 500 MG 12 hr tablet Take 500 mg by mouth 2 (two) times daily.     sildenafil (REVATIO) 20 MG tablet Take by mouth.     traMADol (ULTRAM) 50 MG tablet Take 50 mg by mouth every 6 (six) hours as needed. For pain     No current facility-administered medications for this visit.    Allergies:   Gabapentin  Social History:   reports that he has quit smoking. He does not have any smokeless tobacco history on file. He reports that he does not drink alcohol and does not use drugs.   Family History:  family history is not on file.    ROS:     Review of Systems  Constitutional: Negative.   HENT: Negative.    Eyes: Negative.   Respiratory:  Positive for shortness of breath.   Cardiovascular:  Positive for leg swelling. Negative for chest pain.  Gastrointestinal: Negative.   Genitourinary: Negative.   Musculoskeletal: Negative.   Skin: Negative.   Neurological: Negative.   Endo/Heme/Allergies: Negative.   Psychiatric/Behavioral: Negative.    All other systems reviewed and are negative.     All other systems are reviewed and negative.    PHYSICAL EXAM: VS:  BP 90/65   Pulse 86   Ht 5\' 8"  (1.727 m)   Wt 170 lb 3.2 oz (77.2 kg)   SpO2 98%   BMI 25.88 kg/m  , BMI Body mass index is 25.88 kg/m. Last weight:  Wt Readings from  Last 3 Encounters:  08/06/23 170 lb 3.2 oz (77.2 kg)  07/30/23 170 lb 6.4 oz (77.3 kg)  04/14/16 216 lb (98 kg)     Physical Exam Vitals reviewed.  Constitutional:      Appearance: Normal appearance. He is normal weight.  HENT:     Head: Normocephalic.     Nose: Nose normal.     Mouth/Throat:     Mouth: Mucous membranes are moist.  Eyes:     Pupils: Pupils are equal, round, and reactive to light.  Cardiovascular:     Rate and Rhythm: Normal rate and regular rhythm.     Pulses: Normal pulses.     Heart sounds: Normal heart sounds.  Pulmonary:     Effort: Pulmonary effort is normal.  Abdominal:     General: Abdomen is flat. Bowel sounds are normal.  Musculoskeletal:        General: Normal range of motion.     Cervical back: Normal range of motion.  Skin:    General: Skin is warm.  Neurological:     General: No focal deficit present.     Mental Status: He is alert.  Psychiatric:        Mood and Affect: Mood normal.       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    Lipid Panel No results found for: "CHOL", "TRIG", "HDL", "CHOLHDL", "VLDL", "LDLCALC", "LDLDIRECT"    Other studies Reviewed: Additional studies/ records that were reviewed today include:  Review of the above records demonstrates:       No data to display            ASSESSMENT AND PLAN:    ICD-10-CM   1. Primary hypertension  I10 MYOCARDIAL PERFUSION IMAGING    PCV ECHOCARDIOGRAM COMPLETE    2. Paroxysmal atrial flutter (HCC)  I48.92 MYOCARDIAL PERFUSION IMAGING    PCV ECHOCARDIOGRAM COMPLETE   Will do echocardiogram to further evaluate ejection fraction.  Currently in sinus rhythm.    3. Heart failure, unspecified HF chronicity, unspecified heart failure type (HCC)  I50.9 MYOCARDIAL PERFUSION IMAGING    PCV ECHOCARDIOGRAM COMPLETE    4. 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  Will do nuclear stress test to further evaluate.    5. Type 2 diabetes mellitus with other specified complication, with long-term current use of insulin (HCC)  E11.69 MYOCARDIAL PERFUSION IMAGING   Z79.4 PCV ECHOCARDIOGRAM COMPLETE    6. S/P ablation of atrial fibrillation  Z98.890 MYOCARDIAL PERFUSION IMAGING   Z86.79 PCV ECHOCARDIOGRAM COMPLETE    7. Mixed hyperlipidemia  E78.2 MYOCARDIAL PERFUSION IMAGING    PCV ECHOCARDIOGRAM COMPLETE    8. SOB (shortness of breath)  R06.02 MYOCARDIAL PERFUSION IMAGING    PCV ECHOCARDIOGRAM COMPLETE   Do ECHO, stress test    9. 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, do echo, stress test       Problem List Items Addressed This Visit       Cardiovascular and Mediastinum   Paroxysmal atrial flutter (HCC) (Chronic)   Relevant Orders   MYOCARDIAL PERFUSION IMAGING   PCV ECHOCARDIOGRAM COMPLETE   Primary hypertension - Primary (Chronic)   Relevant Orders   MYOCARDIAL PERFUSION IMAGING   PCV ECHOCARDIOGRAM COMPLETE   Coronary artery disease   Relevant Orders   MYOCARDIAL PERFUSION IMAGING   PCV ECHOCARDIOGRAM COMPLETE   Heart failure (HCC)   Relevant Orders   MYOCARDIAL PERFUSION IMAGING   PCV ECHOCARDIOGRAM COMPLETE     Endocrine   Diabetes mellitus, type II (HCC)   Relevant Orders   MYOCARDIAL PERFUSION IMAGING   PCV ECHOCARDIOGRAM COMPLETE     Other   Mixed hyperlipidemia (Chronic)   Relevant Orders   MYOCARDIAL PERFUSION IMAGING   PCV ECHOCARDIOGRAM COMPLETE   S/P ablation of atrial fibrillation (Chronic)   Relevant Orders   MYOCARDIAL PERFUSION IMAGING   PCV ECHOCARDIOGRAM COMPLETE   Other Visit Diagnoses     SOB (shortness of breath)        Do ECHO, stress test   Relevant Orders   MYOCARDIAL PERFUSION IMAGING   PCV ECHOCARDIOGRAM COMPLETE   Abnormal electrocardiogram (ECG) (EKG)       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    Total time spent: 50 minutes  Signed,  Adrian Blackwater, MD  08/06/2023 3:09 PM    [facility]

## 2023-08-10 ENCOUNTER — Encounter: Payer: Self-pay | Admitting: Cardiovascular Disease

## 2023-08-17 ENCOUNTER — Ambulatory Visit: Payer: 59 | Admitting: Nurse Practitioner

## 2023-08-21 ENCOUNTER — Other Ambulatory Visit: Payer: 59

## 2023-08-21 DIAGNOSIS — H2511 Age-related nuclear cataract, right eye: Secondary | ICD-10-CM | POA: Diagnosis not present

## 2023-08-21 DIAGNOSIS — Z961 Presence of intraocular lens: Secondary | ICD-10-CM | POA: Diagnosis not present

## 2023-08-21 DIAGNOSIS — H353121 Nonexudative age-related macular degeneration, left eye, early dry stage: Secondary | ICD-10-CM | POA: Diagnosis not present

## 2023-08-26 ENCOUNTER — Ambulatory Visit: Payer: 59 | Admitting: Cardiology

## 2023-10-27 ENCOUNTER — Ambulatory Visit: Payer: 59 | Admitting: Nurse Practitioner

## 2023-10-29 ENCOUNTER — Encounter: Payer: Self-pay | Admitting: Cardiovascular Disease

## 2023-10-29 ENCOUNTER — Ambulatory Visit: Payer: 59 | Admitting: Cardiovascular Disease

## 2023-10-29 VITALS — BP 134/70 | HR 60 | Ht 68.0 in | Wt 164.2 lb

## 2023-10-29 DIAGNOSIS — R0789 Other chest pain: Secondary | ICD-10-CM | POA: Diagnosis not present

## 2023-10-29 DIAGNOSIS — R9431 Abnormal electrocardiogram [ECG] [EKG]: Secondary | ICD-10-CM | POA: Diagnosis not present

## 2023-10-29 DIAGNOSIS — I4892 Unspecified atrial flutter: Secondary | ICD-10-CM | POA: Diagnosis not present

## 2023-10-29 DIAGNOSIS — I509 Heart failure, unspecified: Secondary | ICD-10-CM | POA: Diagnosis not present

## 2023-10-29 DIAGNOSIS — Z8679 Personal history of other diseases of the circulatory system: Secondary | ICD-10-CM

## 2023-10-29 DIAGNOSIS — Z9889 Other specified postprocedural states: Secondary | ICD-10-CM

## 2023-10-29 DIAGNOSIS — R0602 Shortness of breath: Secondary | ICD-10-CM | POA: Diagnosis not present

## 2023-10-29 DIAGNOSIS — I10 Essential (primary) hypertension: Secondary | ICD-10-CM | POA: Diagnosis not present

## 2023-10-29 DIAGNOSIS — I25118 Atherosclerotic heart disease of native coronary artery with other forms of angina pectoris: Secondary | ICD-10-CM

## 2023-10-29 NOTE — Progress Notes (Signed)
Cardiology Office Note   Date:  10/29/2023   ID:  Robert BIGA Sr., DOB [birth year], MRN [id number]  PCP:  No primary care provider on file.  Cardiologist:  Adrian Blackwater, MD      History of Present Illness: Robert Garcia. is a 77 y.o. male who presents for  Chief Complaint  Patient presents with   Follow-up    3 month follow up    FEELS DEPRESSED AS LOST HIS WIFE.      Past Medical History:  Diagnosis Date   Arthritis    back, arms   Enlarged prostate    was on Flomax but not taking it   Headache(784.0)    left ear stopped up   Hyperlipidemia    takes Trilipix daily   Low back pain    Pneumonia    hx of;15+yrs ago   Seasonal allergies    Vertigo    last episode - last year (2016)   Wears dentures    has full upper and lower.  does NOT wear     Past Surgical History:  Procedure Laterality Date   CATARACT EXTRACTION W/PHACO Left 04/14/2016   Procedure: CATARACT EXTRACTION PHACO AND INTRAOCULAR LENS PLACEMENT (IOC);  Surgeon: Sherald Hess, MD;  Location: [facility];  Service: Ophthalmology;  Laterality: Left;   COLONOSCOPY     HAND SURGERY Left    multiple teeth extracted       Current Outpatient Medications  Medication Sig Dispense Refill   amiodarone (PACERONE) 200 MG tablet Take 200 mg by mouth 2 (two) times daily.     aspirin 325 MG tablet Take 325 mg by mouth daily.     ASPIRIN-CAFFEINE PO Take by mouth as needed. Reported on 04/14/2016     atorvastatin (LIPITOR) 40 MG tablet Take 40 mg by mouth daily.     Cholecalciferol (VITAMIN D-3 PO) Take by mouth daily.     digoxin (LANOXIN) 0.125 MG tablet Take 125 mcg by mouth daily.     ELIQUIS 5 MG TABS tablet Take 5 mg by mouth 2 (two) times daily.     FARXIGA 10 MG TABS tablet Take 10 mg by mouth daily.     fluticasone (FLONASE) 50 MCG/ACT nasal spray 2 sprays into each nostril two (2) times a day.     levocetirizine (XYZAL) 5 MG tablet Take 1 tablet by mouth every evening.      meclizine (ANTIVERT) 25 MG tablet Take 25 mg by mouth 3 (three) times daily as needed for dizziness.     nitroGLYCERIN (NITROSTAT) 0.4 MG SL tablet PLACE 1 TAB UNDER TONGUE FOR CHEST PAIN EVERY FIVE MINUTES--MAX OF 3 DOSES THEN CALL 911 OR GO TO ER     ranolazine (RANEXA) 500 MG 12 hr tablet Take 500 mg by mouth 2 (two) times daily.     sildenafil (REVATIO) 20 MG tablet Take by mouth.     traMADol (ULTRAM) 50 MG tablet Take 50 mg by mouth every 6 (six) hours as needed. For pain     No current facility-administered medications for this visit.    Allergies:   Gabapentin    Social History:   reports that he has quit smoking. He does not have any smokeless tobacco history on file. He reports that he does not drink alcohol and does not use drugs.   Family History:  family history is not on file.    ROS:     Review of Systems  Constitutional: Negative.   HENT: Negative.    Eyes: Negative.   Respiratory: Negative.    Gastrointestinal: Negative.   Genitourinary: Negative.   Musculoskeletal: Negative.   Skin: Negative.   Neurological: Negative.   Endo/Heme/Allergies: Negative.   Psychiatric/Behavioral: Negative.    All other systems reviewed and are negative.     All other systems are reviewed and negative.    PHYSICAL EXAM: VS:  BP 134/70   Pulse 60   Ht 5\' 8"  (1.727 m)   Wt 164 lb 3.2 oz (74.5 kg)   SpO2 95%   BMI 24.97 kg/m  , BMI Body mass index is 24.97 kg/m. Last weight:  Wt Readings from Last 3 Encounters:  10/29/23 164 lb 3.2 oz (74.5 kg)  08/06/23 170 lb 3.2 oz (77.2 kg)  07/30/23 170 lb 6.4 oz (77.3 kg)     Physical Exam Vitals reviewed.  Constitutional:      Appearance: Normal appearance. He is normal weight.  HENT:     Head: Normocephalic.     Nose: Nose normal.     Mouth/Throat:     Mouth: Mucous membranes are moist.  Eyes:     Pupils: Pupils are equal, round, and reactive to light.  Cardiovascular:     Rate and Rhythm: Normal rate and  regular rhythm.     Pulses: Normal pulses.     Heart sounds: Normal heart sounds.  Pulmonary:     Effort: Pulmonary effort is normal.  Abdominal:     General: Abdomen is flat. Bowel sounds are normal.  Musculoskeletal:        General: Normal range of motion.     Cervical back: Normal range of motion.  Skin:    General: Skin is warm.  Neurological:     General: No focal deficit present.     Mental Status: He is alert.  Psychiatric:        Mood and Affect: Mood normal.       EKG:   Recent Labs: No results found for requested labs within last 365 days.    Lipid Panel No results found for: "CHOL", "TRIG", "HDL", "CHOLHDL", "VLDL", "LDLCALC", "LDLDIRECT"    Other studies Reviewed: Additional studies/ records that were reviewed today include:  Review of the above records demonstrates:       No data to display            ASSESSMENT AND PLAN:    ICD-10-CM   1. Other chest pain  R07.89 PCV ECHOCARDIOGRAM COMPLETE    MYOCARDIAL PERFUSION IMAGING   ADVISE ECHO, STRESS TEST AS WAS NOT DONE YET    2. Primary hypertension  I10 PCV ECHOCARDIOGRAM COMPLETE    MYOCARDIAL PERFUSION IMAGING    3. Paroxysmal atrial flutter (HCC)  I48.92 PCV ECHOCARDIOGRAM COMPLETE    MYOCARDIAL PERFUSION IMAGING    4. Heart failure, unspecified HF chronicity, unspecified heart failure type (HCC)  I50.9 PCV ECHOCARDIOGRAM COMPLETE    MYOCARDIAL PERFUSION IMAGING    5. Coronary artery disease involving native coronary artery of native heart with other form of angina pectoris (HCC)  I25.118 PCV ECHOCARDIOGRAM COMPLETE    MYOCARDIAL PERFUSION IMAGING    6. S/P ablation of atrial fibrillation  Z98.890 PCV ECHOCARDIOGRAM COMPLETE   Z86.79 MYOCARDIAL PERFUSION IMAGING    7. SOB (shortness of breath)  R06.02 PCV ECHOCARDIOGRAM COMPLETE    MYOCARDIAL PERFUSION IMAGING   HAS CAD, AND GETS SOB ON EXERTION. GETS BETTER AFTER SITTING DOWN. HAD ECHO, STRESS TEST NOT DONE  Aug 06, 2023 AS WIFE PASSED AWAY.  RESCHEDUAL NOW    8. Abnormal electrocardiogram (ECG) (EKG)  R94.31 PCV ECHOCARDIOGRAM COMPLETE    MYOCARDIAL PERFUSION IMAGING       Problem List Items Addressed This Visit       Cardiovascular and Mediastinum   Paroxysmal atrial flutter (HCC) (Chronic)   Relevant Orders   PCV ECHOCARDIOGRAM COMPLETE   MYOCARDIAL PERFUSION IMAGING   Primary hypertension (Chronic)   Relevant Orders   PCV ECHOCARDIOGRAM COMPLETE   MYOCARDIAL PERFUSION IMAGING   Coronary artery disease   Relevant Orders   PCV ECHOCARDIOGRAM COMPLETE   MYOCARDIAL PERFUSION IMAGING   Heart failure (HCC)   Relevant Orders   PCV ECHOCARDIOGRAM COMPLETE   MYOCARDIAL PERFUSION IMAGING     Other   S/P ablation of atrial fibrillation (Chronic)   Relevant Orders   PCV ECHOCARDIOGRAM COMPLETE   MYOCARDIAL PERFUSION IMAGING   Other Visit Diagnoses       Other chest pain    -  Primary   ADVISE ECHO, STRESS TEST AS WAS NOT DONE YET   Relevant Orders   PCV ECHOCARDIOGRAM COMPLETE   MYOCARDIAL PERFUSION IMAGING     SOB (shortness of breath)       HAS CAD, AND GETS SOB ON EXERTION. GETS BETTER AFTER SITTING DOWN. HAD ECHO, STRESS TEST NOT DONE 08/06/2023 AS WIFE PASSED AWAY. RESCHEDUAL NOW   Relevant Orders   PCV ECHOCARDIOGRAM COMPLETE   MYOCARDIAL PERFUSION IMAGING     Abnormal electrocardiogram (ECG) (EKG)       Relevant Orders   PCV ECHOCARDIOGRAM COMPLETE   MYOCARDIAL PERFUSION IMAGING          Disposition:   Return in about 4 weeks (around 11/26/2023) for ECHO, STRESS TEST AND F/U.    Total time spent: 30 minutes  Signed,  Adrian Blackwater, MD  10/29/2023 2:01 PM    [facility]

## 2023-10-30 ENCOUNTER — Encounter: Payer: Self-pay | Admitting: Cardiology

## 2023-10-30 ENCOUNTER — Ambulatory Visit: Payer: 59 | Attending: Cardiology | Admitting: Cardiology

## 2023-10-30 VITALS — BP 108/56 | HR 53 | Ht 68.0 in | Wt 163.6 lb

## 2023-10-30 DIAGNOSIS — I480 Paroxysmal atrial fibrillation: Secondary | ICD-10-CM | POA: Diagnosis not present

## 2023-10-30 DIAGNOSIS — R079 Chest pain, unspecified: Secondary | ICD-10-CM

## 2023-10-30 DIAGNOSIS — E7800 Pure hypercholesterolemia, unspecified: Secondary | ICD-10-CM | POA: Diagnosis not present

## 2023-10-30 NOTE — Patient Instructions (Addendum)
Medication Instructions:   STOP Digoxin DECREASE Amiodarone - Take one tablet ( 200mg ) by mouth daily.   *If you need a refill on your cardiac medications before your next appointment, please call your pharmacy*   Lab Work:  Your provider would like for you to return The day of your Echocardiogram to have the following labs drawn: LIPID.   Please go to [facility] [address] ([facility] [address], [place] [address] You do not need an appointment.  They are open from 7:30 am-4 pm.  Lunch from 1:00 pm- 2:00 pm You WILL need to be fasting.    If you have labs (blood work) drawn today and your tests are completely normal, you will receive your results only by: MyChart Message (if you have MyChart) OR A paper copy in the mail If you have any lab test that is abnormal or we need to change your treatment, we will call you to review the results.   Testing/Procedures:  Your physician has requested that you have an echocardiogram. Echocardiography is a painless test that uses sound waves to create images of your heart. It provides your doctor with information about the size and shape of your heart and how well your heart's chambers and valves are working. This procedure takes approximately one hour. There are no restrictions for this procedure. Please do NOT wear cologne, perfume, aftershave, or lotions (deodorant is allowed). Please arrive 15 minutes prior to your appointment time.  Please note: We ask at that you not bring children with you during ultrasound (echo/ vascular) testing. Due to room size and safety concerns, children are not allowed in the ultrasound rooms during exams. Our front office staff cannot provide observation of children in our lobby area while testing is being conducted. An adult accompanying a patient to their appointment will only be allowed in the ultrasound room at the discretion of the ultrasound technician under special circumstances. We  apologize for any inconvenience.    Follow-Up: At [facility], you and your health needs are our priority.  As part of our continuing mission to provide you with exceptional heart care, we have created designated Provider Care Teams.  These Care Teams include your primary Cardiologist (physician) and Advanced Practice Providers (APPs -  Physician Assistants and Nurse Practitioners) who all work together to provide you with the care you need, when you need it.  We recommend signing up for the patient portal called "MyChart".  Sign up information is provided on this After Visit Summary.  MyChart is used to connect with patients for Virtual Visits (Telemedicine).  Patients are able to view lab/test results, encounter notes, upcoming appointments, etc.  Non-urgent messages can be sent to your provider as well.   To learn more about what you can do with MyChart, go to [email or web address].    Your next appointment:   2 month(s)  Provider:   You may see Debbe Odea, MD or one of the following Advanced Practice Providers on your designated Care Team:   Nicolasa Ducking, NP Eula Listen, PA-C Cadence Fransico Michael, PA-C Charlsie Quest, NP Carlos Levering, NP

## 2023-10-30 NOTE — Progress Notes (Signed)
Cardiology Office Note:    Date:  10/30/2023   ID:  Robert STVINCENT Sr., DOB 08-Aug-1946, MRN [id number]  PCP:  No primary care provider on file.   [facility] Providers Cardiologist:  Debbe Odea, MD     Referring MD: Sallyanne Kuster, NP   Chief Complaint  Patient presents with   New Patient (Initial Visit)    Referred for cardiac evaluation.  Previously seen by [facility] in [place] also seen by Dr. Welton Flakes with last visit yesterday.  Patient prefers to have 2nd opinion.  Patient is a poor historian and states not think clearly due to recent loss of wife.    History of Present Illness:    Robert Garcia. is a 77 y.o. male with a hx of paroxysmal atrial fibrillation s/p ablation 2022, hyperlipidemia, diabetes who presents to establish care.  Previously seen in [facility] from a cardiac perspective.  Diagnosed with atrial fibrillation 2 years ago, underwent ablation.  Started on Eliquis which patient has been taking since.  Also takes amiodarone, digoxin.  States not thinking clearly due to recently losing his wife last month.  Has occasional chest discomfort.  States undergoing ischemic eval last year with previous cardiologist, was told everything was okay.  Denies palpitations.  Takes sublingual nitro and aspirin to help with chest pain.  Past Medical History:  Diagnosis Date   Arteriosclerosis of coronary artery    Arthritis    back, arms   B12 deficiency    Coronary artery disease of native artery of native heart with stable angina pectoris (HCC)    Enlarged prostate    was on Flomax but not taking it   Gastroesophageal reflux disease without esophagitis    Headache(784.0)    left ear stopped up   Hyperlipidemia    takes Trilipix daily   Low back pain    Low back pain with sciatica    Mixed hyperlipidemia    Paroxysmal atrial fibrillation (HCC)    Pneumonia    hx of;15+yrs ago   Primary hypertension    Seasonal  allergies    Stage 3a chronic kidney disease (CKD) (HCC)    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  does NOT wear    Past Surgical History:  Procedure Laterality Date   ABLATION     ablation of atrial fibrillation   CARDIAC CATHETERIZATION  2023   CATARACT EXTRACTION W/PHACO Left 04/14/2016   Procedure: CATARACT EXTRACTION PHACO AND INTRAOCULAR LENS PLACEMENT (IOC);  Surgeon: Sherald Hess, MD;  Location: [facility];  Service: Ophthalmology;  Laterality: Left;   COLONOSCOPY     CORONARY ANGIOPLASTY WITH STENT PLACEMENT  04/2022   HAND SURGERY Left    multiple teeth extracted      Current Medications: Current Meds  Medication Sig   amiodarone (PACERONE) 200 MG tablet Take 200 mg by mouth daily.   aspirin 325 MG tablet Take 325 mg by mouth daily.   ASPIRIN-CAFFEINE PO Take by mouth as needed. Reported on 04/14/2016   atorvastatin (LIPITOR) 40 MG tablet Take 40 mg by mouth daily.   digoxin (LANOXIN) 0.125 MG tablet Take 125 mcg by mouth daily.   ELIQUIS 5 MG TABS tablet Take 5 mg by mouth 2 (two) times daily.  FARXIGA 10 MG TABS tablet Take 10 mg by mouth daily.   fluticasone (FLONASE) 50 MCG/ACT nasal spray 2 sprays into each nostril two (2) times a day.   levocetirizine (XYZAL) 5 MG tablet Take 1 tablet by mouth every evening.   meclizine (ANTIVERT) 25 MG tablet Take 25 mg by mouth 3 (three) times daily as needed for dizziness.   nitroGLYCERIN (NITROSTAT) 0.4 MG SL tablet PLACE 1 TAB UNDER TONGUE FOR CHEST PAIN EVERY FIVE MINUTES--MAX OF 3 DOSES THEN CALL 911 OR GO TO ER   ranolazine (RANEXA) 500 MG 12 hr tablet Take 500 mg by mouth 2 (two) times daily.     Allergies:   Gabapentin   Social History   Socioeconomic History   Marital status: Married    Spouse name: Not on file   Number of children: Not on file    Years of education: Not on file   Highest education level: Not on file  Occupational History   Not on file  Tobacco Use   Smoking status: Former   Smokeless tobacco: Not on file   Tobacco comments:    quit smoking 81yrs ago  Vaping Use   Vaping status: Never Used  Substance and Sexual Activity   Alcohol use: No   Drug use: No   Sexual activity: Not Currently  Other Topics Concern   Not on file  Social History Narrative   Not on file   Social Drivers of Health   Financial Resource Strain: Low Risk  (02/05/2023)   Received from [facility]   Overall Financial Resource Strain (CARDIA)    Difficulty of Paying Living Expenses: Not hard at all  Food Insecurity: No Food Insecurity (02/05/2023)   Received from [facility]   Hunger Vital Sign    Worried About Running Out of Food in the Last Year: Never true    Ran Out of Food in the Last Year: Never true  Transportation Needs: No Transportation Needs (02/05/2023)   Received from [facility]   PRAPARE - Transportation    Lack of Transportation (Medical): No    Lack of Transportation (Non-Medical): No  Physical Activity: Insufficiently Active (12/31/2022)   Received from [facility]   Exercise Vital Sign    Days of Exercise per Week: 2 days    Minutes of Exercise per Session: 30 min  Stress: No Stress Concern Present (12/31/2022)   Received from [facility] - Occupational Stress Questionnaire    Feeling of Stress : Not at all  Social Connections: Socially Integrated (12/31/2022)   Received from [facility]   Social Connection and Isolation Panel [NHANES]    Frequency of Communication with Friends and Family: More than three times a week    Frequency of Social Gatherings with Friends and Family: More than three times a week    Attends Religious Services: More than 4 times per year    Active Member of [employer] or Organizations: Yes    Attends Hospital doctor: More than 4 times per year    Marital Status: Married     Family History: The patient's family history includes Heart disease in his father and mother.  ROS:   Please see the history of present illness.     All other systems reviewed and are negative.  EKGs/Labs/Other Studies Reviewed:    The following studies were reviewed today:  EKG Interpretation Date/Time:  Friday October 30 2023 10:04:39 EST Ventricular Rate:  53 PR Interval:  172 QRS Duration:  124 QT Interval:  462 QTC Calculation: 433 R Axis:   -44  Text Interpretation: Sinus bradycardia Left axis deviation Non-specific intra-ventricular conduction delay Confirmed by Debbe Odea ([id number]) on 10/30/2023 10:14:56 AM    Recent Labs: No results found for requested labs within last 365 days.  Recent Lipid Panel No results found for: "CHOL", "TRIG", "HDL", "CHOLHDL", "VLDL", "LDLCALC", "LDLDIRECT"   Risk Assessment/Calculations:             Physical Exam:    VS:  BP (!) 108/56 (BP Location: Left Arm, Patient Position: Sitting, Cuff Size: Normal)   Pulse (!) 53   Ht 5\' 8"  (1.727 m)   Wt 163 lb 9.6 oz (74.2 kg)   SpO2 93%   BMI 24.88 kg/m     Wt Readings from Last 3 Encounters:  10/30/23 163 lb 9.6 oz (74.2 kg)  10/29/23 164 lb 3.2 oz (74.5 kg)  08/06/23 170 lb 3.2 oz (77.2 kg)     GEN:  Well nourished, well developed in no acute distress HEENT: Normal NECK: No JVD; No carotid bruits CARDIAC: RRR, no murmurs, rubs, gallops RESPIRATORY:  Clear to auscultation without rales, wheezing or rhonchi  ABDOMEN: Soft, non-tender, non-distended MUSCULOSKELETAL:  No edema; No deformity  SKIN: Warm and dry NEUROLOGIC:  Alert and oriented x 3 PSYCHIATRIC:  Normal affect   ASSESSMENT:    1. Paroxysmal atrial fibrillation (HCC)   2. Pure hypercholesterolemia   3. Chest pain, unspecified type    PLAN:    In order of problems listed above:  Paroxysmal atrial fibrillation s/p ablation 2022.  EKG  today showing sinus bradycardia heart rate 53.  Stop digoxin, reduce amiodarone to 200 mg daily, continue Eliquis.  Obtain echocardiogram. Hyperlipidemia, continue Lipitor 40 mg daily, obtain fasting lipid profile. Chest pain, obtain recent ischemic eval from previous cardiologist.  Continue Eliquis, Lipitor, Ranexa.  Recommended stopping full dose aspirin due to being on Eliquis but patient states he will like to continue aspirin.  Follow-up after echo.  Last seen by [facility].  Patient wants to follow-up with [facility].      Medication Adjustments/Labs and Tests Ordered: Current medicines are reviewed at length with the patient today.  Concerns regarding medicines are outlined above.  Orders Placed This Encounter  Procedures   Lipid panel   EKG 12-Lead   ECHOCARDIOGRAM COMPLETE   No orders of the defined types were placed in this encounter.   Patient Instructions  Medication Instructions:   STOP Digoxin DECREASE Amiodarone - Take one tablet ( 200mg ) by mouth daily.   *If you need a refill on your cardiac medications before your next appointment, please call your pharmacy*   Lab Work:  Your provider would like for you to return The day of your Echocardiogram to have the following labs drawn: LIPID.   Please go to [facility] [address] ([facility], [place] [address] You do not need an appointment.  They are open from 7:30 am-4 pm.  Lunch from 1:00 pm- 2:00 pm You WILL need to be fasting.    If you have labs (blood work) drawn today and your tests are completely normal, you will receive your results only by: MyChart Message (if you have MyChart) OR A paper copy in the mail If you have any lab test that is abnormal or we need to change your treatment, we will call  you to review the results.   Testing/Procedures:  Your physician has requested that you have an echocardiogram. Echocardiography is a painless  test that uses sound waves to create images of your heart. It provides your doctor with information about the size and shape of your heart and how well your heart's chambers and valves are working. This procedure takes approximately one hour. There are no restrictions for this procedure. Please do NOT wear cologne, perfume, aftershave, or lotions (deodorant is allowed). Please arrive 15 minutes prior to your appointment time.  Please note: We ask at that you not bring children with you during ultrasound (echo/ vascular) testing. Due to room size and safety concerns, children are not allowed in the ultrasound rooms during exams. Our front office staff cannot provide observation of children in our lobby area while testing is being conducted. An adult accompanying a patient to their appointment will only be allowed in the ultrasound room at the discretion of the ultrasound technician under special circumstances. We apologize for any inconvenience.    Follow-Up: At [facility], you and your health needs are our priority.  As part of our continuing mission to provide you with exceptional heart care, we have created designated Provider Care Teams.  These Care Teams include your primary Cardiologist (physician) and Advanced Practice Providers (APPs -  Physician Assistants and Nurse Practitioners) who all work together to provide you with the care you need, when you need it.  We recommend signing up for the patient portal called "MyChart".  Sign up information is provided on this After Visit Summary.  MyChart is used to connect with patients for Virtual Visits (Telemedicine).  Patients are able to view lab/test results, encounter notes, upcoming appointments, etc.  Non-urgent messages can be sent to your provider as well.   To learn more about what you can do with MyChart, go to [email or web address].    Your next appointment:   2 month(s)  Provider:   You may see Debbe Odea, MD or one  of the following Advanced Practice Providers on your designated Care Team:   Nicolasa Ducking, NP Eula Listen, PA-C Cadence Fransico Michael, PA-C Charlsie Quest, NP Carlos Levering, NP    Signed, Debbe Odea, MD  10/30/2023 11:11 AM    [facility]

## 2023-11-17 ENCOUNTER — Other Ambulatory Visit: Payer: Self-pay

## 2023-11-17 ENCOUNTER — Telehealth: Payer: Self-pay

## 2023-11-18 ENCOUNTER — Other Ambulatory Visit: Payer: Self-pay

## 2023-11-18 ENCOUNTER — Telehealth: Payer: Self-pay

## 2023-11-18 MED ORDER — ACCU-CHEK GUIDE TEST VI STRP: ORAL_STRIP | 12 refills | Status: DC

## 2023-11-18 MED ORDER — GLUCOSE BLOOD VI STRP: ORAL_STRIP | 12 refills | Status: DC

## 2023-11-18 MED ORDER — ACCU-CHEK GUIDE ME W/DEVICE KIT: PACK | 0 refills | Status: AC

## 2023-11-18 MED ORDER — ACCU-CHEK SOFTCLIX LANCETS MISC: 12 refills | Status: DC

## 2023-11-18 NOTE — Telephone Encounter (Signed)
 Refilled patient's test strips. Patient's wife recently passed away, he is very confused on where he gets specific prescriptions from. Sent to [facility].

## 2023-11-24 ENCOUNTER — Ambulatory Visit (INDEPENDENT_AMBULATORY_CARE_PROVIDER_SITE_OTHER): Payer: 59

## 2023-11-24 ENCOUNTER — Other Ambulatory Visit: Payer: 59

## 2023-11-24 DIAGNOSIS — I25118 Atherosclerotic heart disease of native coronary artery with other forms of angina pectoris: Secondary | ICD-10-CM

## 2023-11-24 DIAGNOSIS — I4892 Unspecified atrial flutter: Secondary | ICD-10-CM | POA: Diagnosis not present

## 2023-11-24 DIAGNOSIS — R0602 Shortness of breath: Secondary | ICD-10-CM

## 2023-11-24 DIAGNOSIS — R0789 Other chest pain: Secondary | ICD-10-CM

## 2023-11-24 DIAGNOSIS — I509 Heart failure, unspecified: Secondary | ICD-10-CM

## 2023-11-24 DIAGNOSIS — Z8679 Personal history of other diseases of the circulatory system: Secondary | ICD-10-CM

## 2023-11-24 DIAGNOSIS — I10 Essential (primary) hypertension: Secondary | ICD-10-CM

## 2023-11-24 DIAGNOSIS — R9431 Abnormal electrocardiogram [ECG] [EKG]: Secondary | ICD-10-CM

## 2023-11-25 ENCOUNTER — Ambulatory Visit: Payer: 59 | Admitting: Nurse Practitioner

## 2023-11-25 ENCOUNTER — Ambulatory Visit
Admission: RE | Admit: 2023-11-25 | Discharge: 2023-11-25 | Disposition: A | Discharge status: Home / Self Care | Payer: 59 | Source: Ambulatory Visit | Attending: Nurse Practitioner | Admitting: Nurse Practitioner

## 2023-11-25 ENCOUNTER — Ambulatory Visit
Admission: RE | Admit: 2023-11-25 | Discharge: 2023-11-25 | Disposition: A | Discharge status: Home / Self Care | Payer: 59 | Attending: Nurse Practitioner | Admitting: Nurse Practitioner

## 2023-11-25 ENCOUNTER — Encounter: Payer: Self-pay | Admitting: Nurse Practitioner

## 2023-11-25 VITALS — BP 130/74 | HR 64 | Temp 98.1°F | Resp 16 | Ht 68.0 in | Wt 167.6 lb

## 2023-11-25 DIAGNOSIS — S9032XA Contusion of left foot, initial encounter: Secondary | ICD-10-CM

## 2023-11-25 DIAGNOSIS — R0989 Other specified symptoms and signs involving the circulatory and respiratory systems: Secondary | ICD-10-CM | POA: Insufficient documentation

## 2023-11-25 DIAGNOSIS — Z79899 Other long term (current) drug therapy: Secondary | ICD-10-CM

## 2023-11-25 DIAGNOSIS — E1122 Type 2 diabetes mellitus with diabetic chronic kidney disease: Secondary | ICD-10-CM

## 2023-11-25 DIAGNOSIS — N1831 Chronic kidney disease, stage 3a: Secondary | ICD-10-CM

## 2023-11-25 LAB — POCT GLYCOSYLATED HEMOGLOBIN (HGB A1C): Hemoglobin A1C: 8.7 % — AB (ref 4.0–5.6)

## 2023-11-25 MED ORDER — FARXIGA 10 MG PO TABS: 10.0000 mg | ORAL_TABLET | Freq: Every day | ORAL | 5 refills | Status: DC

## 2023-11-25 MED ORDER — LEVOCETIRIZINE DIHYDROCHLORIDE 5 MG PO TABS: 5.0000 mg | ORAL_TABLET | Freq: Every evening | ORAL | 11 refills | Status: DC

## 2023-11-25 MED ORDER — TECHNETIUM TC 99M SESTAMIBI GENERIC - CARDIOLITE
33.5000 | Freq: Once | INTRAVENOUS | Status: AC | PRN
  Administered 2023-11-24: 33.5 via INTRAVENOUS

## 2023-11-25 MED ORDER — TECHNETIUM TC 99M SESTAMIBI GENERIC - CARDIOLITE
10.4200 | Freq: Once | INTRAVENOUS | Status: AC | PRN
  Administered 2023-11-24: 10.42 via INTRAVENOUS

## 2023-11-25 MED ORDER — AMIODARONE HCL 200 MG PO TABS: 200.0000 mg | ORAL_TABLET | Freq: Two times a day (BID) | ORAL | 5 refills | Status: DC

## 2023-11-25 MED ORDER — ATORVASTATIN CALCIUM 40 MG PO TABS: 40.0000 mg | ORAL_TABLET | Freq: Every day | ORAL | 5 refills | Status: DC

## 2023-11-25 MED ORDER — ELIQUIS 5 MG PO TABS: 5.0000 mg | ORAL_TABLET | Freq: Two times a day (BID) | ORAL | 5 refills | Status: DC

## 2023-11-25 MED ORDER — RANOLAZINE ER 500 MG PO TB12: 500.0000 mg | ORAL_TABLET | Freq: Two times a day (BID) | ORAL | 5 refills | Status: DC

## 2023-11-25 NOTE — Progress Notes (Signed)
[facility] [address] [place], [place] [address]  Internal MEDICINE  Office Visit Note  Patient Name: Robert Garcia  [id number]  [id number]  Date of Service: 11/25/2023  Chief Complaint  Patient presents with   Diabetes   Gastroesophageal Reflux   Hypertension   Hyperlipidemia   Follow-up    HPI Robert Garcia presents for a follow-up visit for discolored left foot, diabetes, labs and refills.  Poor circulation of feet, bruising of the left foot and a knot/hematoma on the dorsal surface of the left foot. Sluggish capillary refill to bilateral toes left>right. Denies any pain in his left foot, does not remember if he fell or injured his foot.  Diabetes -- needs diabetic foot exam and foot care. Also A1c was checked and is elevated at 8.7 Overdue for screenings, vaccines and due for medicare wellness visit and routine labs.  Due for refills of most of his medications.  His wife passed away in September 28, 2024, patient reports that he has felt lost since she handled a lot of his medical care and medication management. He has not been taking care of himself as well as he should since she passed.     Current Medication: Outpatient Encounter Medications as of 11/25/2023  Medication Sig   Accu-Chek Softclix Lancets lancets Use 2x daily. Dx: E11.65.   aspirin 325 MG tablet Take 325 mg by mouth daily.   ASPIRIN-CAFFEINE PO Take by mouth as needed. Reported on 04/14/2016   Blood Glucose Monitoring Suppl (ACCU-CHEK GUIDE ME) w/Device KIT Use as directed. Dx: E11.65.   Cholecalciferol (VITAMIN D-3 PO) Take by mouth daily.   fluticasone (FLONASE) 50 MCG/ACT nasal spray 2 sprays into each nostril two (2) times a day.   glucose blood (ACCU-CHEK GUIDE TEST) test strip Use 2x daily. Dx: E11.65.   nitroGLYCERIN (NITROSTAT) 0.4 MG SL tablet PLACE 1 TAB UNDER TONGUE FOR CHEST PAIN EVERY FIVE MINUTES--MAX OF 3 DOSES THEN CALL 911 OR GO TO ER   [DISCONTINUED] amiodarone (PACERONE) 200 MG tablet  Take 200 mg by mouth daily.   [DISCONTINUED] atorvastatin (LIPITOR) 40 MG tablet Take 40 mg by mouth daily.   [DISCONTINUED] digoxin (LANOXIN) 0.125 MG tablet Take 125 mcg by mouth daily.   [DISCONTINUED] ELIQUIS 5 MG TABS tablet Take 5 mg by mouth 2 (two) times daily.   [DISCONTINUED] FARXIGA 10 MG TABS tablet Take 10 mg by mouth daily.   [DISCONTINUED] levocetirizine (XYZAL) 5 MG tablet Take 1 tablet by mouth every evening.   [DISCONTINUED] meclizine (ANTIVERT) 25 MG tablet Take 25 mg by mouth 3 (three) times daily as needed for dizziness.   [DISCONTINUED] ranolazine (RANEXA) 500 MG 12 hr tablet Take 500 mg by mouth 2 (two) times daily.   [DISCONTINUED] traMADol (ULTRAM) 50 MG tablet Take 50 mg by mouth every 6 (six) hours as needed. For pain   amiodarone (PACERONE) 200 MG tablet Take 1 tablet (200 mg total) by mouth 2 (two) times daily.   atorvastatin (LIPITOR) 40 MG tablet Take 1 tablet (40 mg total) by mouth daily.   ELIQUIS 5 MG TABS tablet Take 1 tablet (5 mg total) by mouth 2 (two) times daily.   FARXIGA 10 MG TABS tablet Take 1 tablet (10 mg total) by mouth daily.   levocetirizine (XYZAL) 5 MG tablet Take 1 tablet (5 mg total) by mouth every evening.   ranolazine (RANEXA) 500 MG 12 hr tablet Take 1 tablet (500 mg total) by mouth 2 (two) times daily.   No  facility-administered encounter medications on file as of 11/25/2023.    Surgical History: Past Surgical History:  Procedure Laterality Date   ABLATION     ablation of atrial fibrillation   CARDIAC CATHETERIZATION  2023   CATARACT EXTRACTION W/PHACO Left 04/14/2016   Procedure: CATARACT EXTRACTION PHACO AND INTRAOCULAR LENS PLACEMENT (IOC);  Surgeon: Sherald Hess, MD;  Location: [facility];  Service: Ophthalmology;  Laterality: Left;   COLONOSCOPY     CORONARY ANGIOPLASTY WITH STENT PLACEMENT  04/2022   HAND SURGERY Left    multiple teeth extracted      Medical History: Past Medical History:  Diagnosis  Date   Arteriosclerosis of coronary artery    Arthritis    back, arms   B12 deficiency    Coronary artery disease of native artery of native heart with stable angina pectoris (HCC)    Enlarged prostate    was on Flomax but not taking it   Gastroesophageal reflux disease without esophagitis    Headache(784.0)    left ear stopped up   Hyperlipidemia    takes Trilipix daily   Low back pain    Low back pain with sciatica    Mixed hyperlipidemia    Paroxysmal atrial fibrillation (HCC)    Pneumonia    hx of;15+yrs ago   Primary hypertension    Seasonal allergies    Stage 3a chronic kidney disease (CKD) (HCC)    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  does NOT wear    Family History: Family History  Problem Relation Age of Onset   Heart disease Mother    Heart disease Father     Social History   Socioeconomic History   Marital status: Married    Spouse name: Not on file   Number of children: Not on file   Years of education: Not on file   Highest education level: Not on file  Occupational History   Not on file  Tobacco Use   Smoking status: Former   Smokeless tobacco: Not on file   Tobacco comments:    quit smoking 55yrs ago  Vaping Use   Vaping status: Never Used  Substance and Sexual Activity   Alcohol use: No   Drug use: No   Sexual activity: Not Currently  Other Topics Concern   Not on file  Social History Narrative   Not on file   Social Drivers of Health   Financial Resource Strain: Low Risk  (02/05/2023)   Received from [facility]   Overall Financial Resource Strain (CARDIA)    Difficulty of Paying Living Expenses: Not hard at all  Food Insecurity: No Food Insecurity (02/05/2023)   Received from [facility]   Hunger Vital Sign    Worried About Running Out of Food in the Last Year: Never true     Ran Out of Food in the Last Year: Never true  Transportation Needs: No Transportation Needs (02/05/2023)   Received from [facility]   PRAPARE - Transportation    Lack of Transportation (Medical): No    Lack of Transportation (Non-Medical): No  Physical Activity: Insufficiently Active (12/31/2022)   Received from [facility]   Exercise Vital Sign    Days of Exercise per Week: 2 days    Minutes  of Exercise per Session: 30 min  Stress: No Stress Concern Present (12/31/2022)   Received from [facility] of Occupational Health - Occupational Stress Questionnaire    Feeling of Stress : Not at all  Social Connections: Socially Integrated (12/31/2022)   Received from [facility]   Social Connection and Isolation Panel [NHANES]    Frequency of Communication with Friends and Family: More than three times a week    Frequency of Social Gatherings with Friends and Family: More than three times a week    Attends Religious Services: More than 4 times per year    Active Member of [employer] or Organizations: Yes    Attends Engineer, structural: More than 4 times per year    Marital Status: Married  Catering manager Violence: Not on file      Review of Systems  Constitutional:  Positive for fatigue. Negative for chills and unexpected weight change.  HENT:  Negative for congestion, postnasal drip, rhinorrhea, sneezing and sore throat.   Eyes:  Negative for redness.  Respiratory:  Positive for shortness of breath. Negative for cough, chest tightness and wheezing.   Cardiovascular: Negative.  Negative for chest pain, palpitations (paroxysmal atrial flutter) and leg swelling.  Gastrointestinal: Negative.  Negative for abdominal pain, constipation, diarrhea, nausea and vomiting.  Genitourinary:  Negative for dysuria and frequency.  Musculoskeletal:  Positive for arthralgias and back pain. Negative for joint swelling and neck pain.  Skin:  Negative for rash.   Neurological:  Positive for weakness. Negative for tremors and numbness.  Hematological:  Negative for adenopathy. Does not bruise/bleed easily.  Psychiatric/Behavioral:  Positive for behavioral problems (Depression) and sleep disturbance. Negative for self-injury and suicidal ideas. The patient is not nervous/anxious.     Vital Signs: BP 130/74   Pulse 64   Temp 98.1 F (36.7 C)   Resp 16   Ht 5\' 8"  (1.727 m)   Wt 167 lb 9.6 oz (76 kg)   SpO2 93%   BMI 25.48 kg/m    Physical Exam Constitutional:      General: He is not in acute distress.    Appearance: Normal appearance. He is not ill-appearing.  HENT:     Head: Normocephalic and atraumatic.  Eyes:     Pupils: Pupils are equal, round, and reactive to light.  Cardiovascular:     Rate and Rhythm: Normal rate and regular rhythm.  Musculoskeletal:     Left foot: Deformity (nodule/lump of dorsal surface of foot) present.  Feet:     Left foot:     Skin integrity: No erythema (bruising/purplish/blue discoloration of left foot).     Comments: Sluggish capillary refill of the toes on both feet left is worse than right  Neurological:     Mental Status: He is alert and oriented to person, place, and time.  Psychiatric:        Mood and Affect: Mood is depressed. Affect is blunt and flat.        Speech: Speech is delayed.        Behavior: Behavior is slowed. Behavior is cooperative.        Assessment/Plan: 1. Type 2 diabetes mellitus with stage 3a chronic kidney disease, without long-term current use of insulin (HCC) (Primary) A1c is elevated. Discussed diet and lifestyle modifications. Patient is currently on farxiga and no other diabetic medications. Will repeat A1c in a few months. If no improvement or worsening of his A1c, will discuss adding  an additional medication.  - POCT glycosylated hemoglobin (Hb A1C) - Ambulatory referral to Podiatry  2. Stage 3a chronic kidney disease (CKD) (HCC) Reminded patient to have his  labs drawn prior to his next office visit which will be his AWV.   3. Poor peripheral circulation Poor circulation to both feet, left foot xray ordered and referred to podiatry  - DG Foot Complete Left; Future - Ambulatory referral to Podiatry  4. Contusion of left foot, initial encounter Foot xray ordered, and referred to podiatry  - DG Foot Complete Left; Future - Ambulatory referral to Podiatry  5. Encounter for medication review Medication list reviewed, updated and refills ordered  - amiodarone (PACERONE) 200 MG tablet; Take 1 tablet (200 mg total) by mouth 2 (two) times daily.  Dispense: 60 tablet; Refill: 5 - ELIQUIS 5 MG TABS tablet; Take 1 tablet (5 mg total) by mouth 2 (two) times daily.  Dispense: 60 tablet; Refill: 5 - FARXIGA 10 MG TABS tablet; Take 1 tablet (10 mg total) by mouth daily.  Dispense: 30 tablet; Refill: 5 - levocetirizine (XYZAL) 5 MG tablet; Take 1 tablet (5 mg total) by mouth every evening.  Dispense: 30 tablet; Refill: 11 - ranolazine (RANEXA) 500 MG 12 hr tablet; Take 1 tablet (500 mg total) by mouth 2 (two) times daily.  Dispense: 60 tablet; Refill: 5   General Counseling: shey bartmess understanding of the findings of todays visit and agrees with plan of treatment. I have discussed any further diagnostic evaluation that may be needed or ordered today. We also reviewed his medications today. he has been encouraged to call the office with any questions or concerns that should arise related to todays visit.    Orders Placed This Encounter  Procedures   DG Foot Complete Left   Ambulatory referral to Podiatry   POCT glycosylated hemoglobin (Hb A1C)    Meds ordered this encounter  Medications   amiodarone (PACERONE) 200 MG tablet    Sig: Take 1 tablet (200 mg total) by mouth 2 (two) times daily.    Dispense:  60 tablet    Refill:  5    For future refills   atorvastatin (LIPITOR) 40 MG tablet    Sig: Take 1 tablet (40 mg total) by mouth daily.     Dispense:  30 tablet    Refill:  5    For future refills   ELIQUIS 5 MG TABS tablet    Sig: Take 1 tablet (5 mg total) by mouth 2 (two) times daily.    Dispense:  60 tablet    Refill:  5    For future refills   FARXIGA 10 MG TABS tablet    Sig: Take 1 tablet (10 mg total) by mouth daily.    Dispense:  30 tablet    Refill:  5    Fill future refills   levocetirizine (XYZAL) 5 MG tablet    Sig: Take 1 tablet (5 mg total) by mouth every evening.    Dispense:  30 tablet    Refill:  11    For future refills   ranolazine (RANEXA) 500 MG 12 hr tablet    Sig: Take 1 tablet (500 mg total) by mouth 2 (two) times daily.    Dispense:  60 tablet    Refill:  5    For future refills    Return for AWV, Christpher Stogsdill PCP in about 1 month and have labs done prior to office visit .   Total time  spent:30 Minutes Time spent includes review of chart, medications, test results, and follow up plan with the patient.   [place] Controlled Substance Database was reviewed by me.  This patient was seen by Sallyanne Kuster, FNP-C in collaboration with Dr. Beverely Risen as a part of collaborative care agreement.   Shavaughn Seidl R. Tedd Sias, MSN, FNP-C Internal medicine

## 2023-11-30 ENCOUNTER — Ambulatory Visit: Payer: 59

## 2023-12-08 ENCOUNTER — Ambulatory Visit: Payer: 59 | Admitting: Cardiovascular Disease

## 2023-12-09 ENCOUNTER — Encounter: Payer: Self-pay | Admitting: Podiatry

## 2023-12-09 ENCOUNTER — Ambulatory Visit (INDEPENDENT_AMBULATORY_CARE_PROVIDER_SITE_OTHER): Payer: 59 | Admitting: Podiatry

## 2023-12-09 VITALS — Ht 68.0 in | Wt 167.6 lb

## 2023-12-09 DIAGNOSIS — M2142 Flat foot [pes planus] (acquired), left foot: Secondary | ICD-10-CM

## 2023-12-09 DIAGNOSIS — I8390 Asymptomatic varicose veins of unspecified lower extremity: Secondary | ICD-10-CM

## 2023-12-09 DIAGNOSIS — E119 Type 2 diabetes mellitus without complications: Secondary | ICD-10-CM | POA: Diagnosis not present

## 2023-12-09 DIAGNOSIS — M2141 Flat foot [pes planus] (acquired), right foot: Secondary | ICD-10-CM

## 2023-12-09 DIAGNOSIS — S9031XS Contusion of right foot, sequela: Secondary | ICD-10-CM

## 2023-12-09 DIAGNOSIS — E1142 Type 2 diabetes mellitus with diabetic polyneuropathy: Secondary | ICD-10-CM

## 2023-12-09 DIAGNOSIS — Z794 Long term (current) use of insulin: Secondary | ICD-10-CM

## 2023-12-10 ENCOUNTER — Ambulatory Visit: Payer: 59 | Attending: Cardiology

## 2023-12-10 DIAGNOSIS — I480 Paroxysmal atrial fibrillation: Secondary | ICD-10-CM | POA: Diagnosis not present

## 2023-12-10 NOTE — Progress Notes (Signed)
  Subjective:  Patient ID: Robert Garcia., male    DOB: [birth year],  MRN: [id number]  Chief Complaint  Patient presents with   Diabetes    Pt is here due to contusion to left foot, foot is bruised but pt states he is not in any pain, would like to talk about getting some diabetic shoes.    78 y.o. male presents with the above complaint. History confirmed with patient.  Feet and toes do feel cold and numb often  Objective:  Physical Exam: warm, good capillary refill, no trophic changes or ulcerative lesions, normal DP and PT pulses, and abnormal sensory exam with loss of protective sensation and decreased light touch around the toes.  Bruising has resolved.  +1 symmetric nonpitting edema.  Varicosities noted Assessment:   1. Encounter for diabetic foot exam (HCC)   2. Contusion of right foot, sequela   3. Pes planus of both feet   4. Type 2 diabetes mellitus with diabetic polyneuropathy, with long-term current use of insulin (HCC)   5. Asymptomatic varicose veins      Plan:  Patient was evaluated and treated and all questions answered.  Patient educated on diabetes. Discussed proper diabetic foot care and discussed risks and complications of disease. Educated patient in depth on reasons to return to the office immediately should he/she discover anything concerning or new on the feet. All questions answered. Discussed proper shoes as well.  I do think would benefit from diabetic extra-depth shoes and insoles to support his feet he does have polyneuropathy and pes planus deformity as well as venous insufficiency.  Appointment was scheduled for fitting for these  Contusion has resolved likely had a small hematoma due to his Eliquis use.  No evidence that this has persisted or has worsened.  No pain or deformity or active bruising noted to indicate need for x-ray.  Return if symptoms worsen or fail to improve.

## 2023-12-11 LAB — ECHOCARDIOGRAM COMPLETE
AR max vel: 2.35 cm2
AV Area VTI: 2.26 cm2
AV Area mean vel: 2.25 cm2
AV Mean grad: 3 mm[Hg]
AV Peak grad: 6.2 mm[Hg]
Ao pk vel: 1.24 m/s
Area-P 1/2: 3.53 cm2
Calc EF: 58.5 %
S' Lateral: 3.4 cm
Single Plane A2C EF: 62 %
Single Plane A4C EF: 55.9 %

## 2023-12-15 ENCOUNTER — Other Ambulatory Visit: Payer: Self-pay

## 2023-12-15 ENCOUNTER — Emergency Department: Payer: 59

## 2023-12-15 ENCOUNTER — Emergency Department
Admission: EM | Admit: 2023-12-15 | Discharge: 2023-12-15 | Disposition: A | Discharge status: Home / Self Care | Payer: 59 | Attending: Emergency Medicine | Admitting: Emergency Medicine

## 2023-12-15 DIAGNOSIS — Z20822 Contact with and (suspected) exposure to covid-19: Secondary | ICD-10-CM | POA: Insufficient documentation

## 2023-12-15 DIAGNOSIS — E119 Type 2 diabetes mellitus without complications: Secondary | ICD-10-CM | POA: Insufficient documentation

## 2023-12-15 DIAGNOSIS — I2510 Atherosclerotic heart disease of native coronary artery without angina pectoris: Secondary | ICD-10-CM | POA: Insufficient documentation

## 2023-12-15 DIAGNOSIS — R112 Nausea with vomiting, unspecified: Secondary | ICD-10-CM | POA: Insufficient documentation

## 2023-12-15 DIAGNOSIS — R197 Diarrhea, unspecified: Secondary | ICD-10-CM | POA: Diagnosis not present

## 2023-12-15 DIAGNOSIS — E860 Dehydration: Secondary | ICD-10-CM | POA: Insufficient documentation

## 2023-12-15 DIAGNOSIS — W19XXXA Unspecified fall, initial encounter: Secondary | ICD-10-CM | POA: Diagnosis not present

## 2023-12-15 DIAGNOSIS — K529 Noninfective gastroenteritis and colitis, unspecified: Secondary | ICD-10-CM

## 2023-12-15 DIAGNOSIS — R531 Weakness: Secondary | ICD-10-CM | POA: Diagnosis not present

## 2023-12-15 LAB — CBC WITH DIFFERENTIAL/PLATELET
Abs Immature Granulocytes: 0.08 10*3/uL — ABNORMAL HIGH (ref 0.00–0.07)
Basophils Absolute: 0 10*3/uL (ref 0.0–0.1)
Basophils Relative: 0 %
Eosinophils Absolute: 0 10*3/uL (ref 0.0–0.5)
Eosinophils Relative: 0 %
HCT: 40.9 % (ref 39.0–52.0)
Hemoglobin: 13.6 g/dL (ref 13.0–17.0)
Immature Granulocytes: 1 %
Lymphocytes Relative: 3 %
Lymphs Abs: 0.3 10*3/uL — ABNORMAL LOW (ref 0.7–4.0)
MCH: 31.5 pg (ref 26.0–34.0)
MCHC: 33.3 g/dL (ref 30.0–36.0)
MCV: 94.7 fL (ref 80.0–100.0)
Monocytes Absolute: 0.6 10*3/uL (ref 0.1–1.0)
Monocytes Relative: 5 %
Neutro Abs: 10.2 10*3/uL — ABNORMAL HIGH (ref 1.7–7.7)
Neutrophils Relative %: 91 %
Platelets: 179 10*3/uL (ref 150–400)
RBC: 4.32 MIL/uL (ref 4.22–5.81)
RDW: 13.7 % (ref 11.5–15.5)
WBC: 11.2 10*3/uL — ABNORMAL HIGH (ref 4.0–10.5)
nRBC: 0 % (ref 0.0–0.2)

## 2023-12-15 LAB — BASIC METABOLIC PANEL
Anion gap: 15 (ref 5–15)
BUN: 35 mg/dL — ABNORMAL HIGH (ref 8–23)
CO2: 21 mmol/L — ABNORMAL LOW (ref 22–32)
Calcium: 8.6 mg/dL — ABNORMAL LOW (ref 8.9–10.3)
Chloride: 102 mmol/L (ref 98–111)
Creatinine, Ser: 1.35 mg/dL — ABNORMAL HIGH (ref 0.61–1.24)
GFR, Estimated: 55 mL/min — ABNORMAL LOW (ref >60)
Glucose, Bld: 194 mg/dL — ABNORMAL HIGH (ref 70–99)
Potassium: 4.3 mmol/L (ref 3.5–5.1)
Sodium: 138 mmol/L (ref 135–145)

## 2023-12-15 LAB — URINALYSIS, ROUTINE W REFLEX MICROSCOPIC
Bacteria, UA: NONE SEEN
Bilirubin Urine: NEGATIVE
Glucose, UA: >=500 mg/dL — AB
Hgb urine dipstick: NEGATIVE
Ketones, ur: NEGATIVE mg/dL
Leukocytes,Ua: NEGATIVE
Nitrite: NEGATIVE
Protein, ur: NEGATIVE mg/dL
Specific Gravity, Urine: 1.028 (ref 1.005–1.030)
Squamous Epithelial / HPF: 0 /[HPF] (ref 0–5)
pH: 5 (ref 5.0–8.0)

## 2023-12-15 LAB — RESP PANEL BY RT-PCR (RSV, FLU A&B, COVID)  RVPGX2
Influenza A by PCR: NEGATIVE
Influenza B by PCR: NEGATIVE
Resp Syncytial Virus by PCR: NEGATIVE
SARS Coronavirus 2 by RT PCR: NEGATIVE

## 2023-12-15 LAB — TROPONIN I (HIGH SENSITIVITY): Troponin I (High Sensitivity): 9 ng/L (ref <18)

## 2023-12-15 MED ORDER — SODIUM CHLORIDE 0.9 % IV BOLUS
1000.0000 mL | Freq: Once | INTRAVENOUS | Status: AC
  Administered 2023-12-15: 1000 mL via INTRAVENOUS

## 2023-12-15 MED ORDER — IOHEXOL 300 MG/ML  SOLN
100.0000 mL | Freq: Once | INTRAMUSCULAR | Status: AC | PRN
  Administered 2023-12-15: 100 mL via INTRAVENOUS

## 2023-12-15 MED ORDER — ONDANSETRON 4 MG PO TBDP: 4.0000 mg | ORAL_TABLET | Freq: Three times a day (TID) | ORAL | 0 refills | Status: AC | PRN

## 2023-12-15 MED ORDER — ONDANSETRON HCL 4 MG/2ML IJ SOLN
4.0000 mg | Freq: Once | INTRAMUSCULAR | Status: AC
  Administered 2023-12-15: 4 mg via INTRAVENOUS
  Filled 2023-12-15: qty 2

## 2023-12-15 MED ORDER — OXYCODONE-ACETAMINOPHEN 5-325 MG PO TABS: 1.0000 | ORAL_TABLET | Freq: Four times a day (QID) | ORAL | 0 refills | Status: DC | PRN

## 2023-12-15 MED ORDER — SODIUM CHLORIDE 0.9 % IV BOLUS
500.0000 mL | Freq: Once | INTRAVENOUS | Status: AC
  Administered 2023-12-15: 500 mL via INTRAVENOUS

## 2023-12-15 MED ORDER — OXYCODONE-ACETAMINOPHEN 5-325 MG PO TABS
1.0000 | ORAL_TABLET | Freq: Once | ORAL | Status: AC
  Administered 2023-12-15: 1 via ORAL
  Filled 2023-12-15: qty 1

## 2023-12-15 NOTE — ED Triage Notes (Signed)
 Pt to ED from home, EMS for 2 falls today. States had vomiting and diarrhea since last night. No urinary symptoms. 3-4 episodes of vomiting and diarrhea per day. States legs have been weak.   Pt hit R side when fell. Pt takes Eliquis . Also hurting to L ribs.   PA in triage. Pt alert and oriented.

## 2023-12-15 NOTE — ED Provider Triage Note (Signed)
 Emergency Medicine Provider Triage Evaluation Note  NELTON AMSDEN Sr. , a 78 y.o. male  was evaluated in triage.  Pt complains of falling twice today, patient states having nauseous vomiting diarrhea in the last 24 hours patient states pain right and left ribs.  Review of Systems  Positive:  Negative:   Physical Exam  Ht 5' 8 (1.727 m)   Wt 76 kg   BMI 25.48 kg/m  Gen:   Awake, no distress   Resp:  Normal effort  MSK:   Moves extremities without difficulty Other:    Medical Decision Making  Medically screening exam initiated at 5:15 PM.  Appropriate orders placed.  [name] was informed that the remainder of the evaluation will be completed by another provider, this initial triage assessment does not replace that evaluation, and the importance of remaining in the ED until their evaluation is complete.  Patient with nauseous vomiting diarrhea full orders CBC CMP lipase EKG chest x-ray   Janit Kast, PA-C 12/15/23 1716

## 2023-12-15 NOTE — ED Triage Notes (Signed)
Pt comes via EMs from home with c/o 2 unwit falls today. Pt deneis any loc or hitting head. Pt is on eliquis. Pt states left sided rib pain. Pt also having  diarrhea and vomiting.   20- g right forearm

## 2023-12-15 NOTE — ED Notes (Signed)
Patient ambulated using standby assist and walker. O2 sats dropped to 90% on RA, immediately returned to 100% RA upon sitting

## 2023-12-15 NOTE — ED Provider Notes (Signed)
 [facility] Provider Note    Event Date/Time   First MD Initiated Contact with Patient 12/15/23 2026     (approximate)  History   Chief Complaint: Fall  HPI  Corrin Hingle. is a 78 y.o. male with a past medical history of CAD, hyperlipidemia, diabetes, presents to the emergency department for generalized weakness nausea vomiting and 2 falls.  According to the patient since yesterday he has been experiencing nausea vomiting and mild diarrhea.  Patient states he vomited many times yesterday and overnight.  He states today he had 2 different falls due to generalized weakness.  Upon arrival patient noted to be quite hypotensive 75/63.  Patient received 500 cc of fluids prior to my evaluation on recheck patient's blood pressure in the room is 118 systolic.  Patient denies any fever denies any cough or congestion denies any chest pain or shortness of breath.  Patient states he has been experiencing generalized abdominal pain that has worsened since he fell.  Did not hit his head.  Patient does take Eliquis .  Physical Exam   Triage Vital Signs: ED Triage Vitals  Encounter Vitals Group     BP 12/15/23 1714 (!) 79/54     Systolic BP Percentile --      Diastolic BP Percentile --      Pulse Rate 12/15/23 1714 78     Resp 12/15/23 1714 20     Temp 12/15/23 1714 98 F (36.7 C)     Temp Source 12/15/23 1714 Oral     SpO2 12/15/23 1714 96 %     Weight 12/15/23 1710 167 lb 8.8 oz (76 kg)     Height 12/15/23 1710 5' 8 (1.727 m)     Head Circumference --      Peak Flow --      Pain Score 12/15/23 1711 8     Pain Loc --      Pain Education --      Exclude from Growth Chart --     Most recent vital signs: Vitals:   12/15/23 1714 12/15/23 1722  BP: (!) 79/54 (!) 75/63  Pulse: 78   Resp: 20   Temp: 98 F (36.7 C)   SpO2: 96%     General: Awake, no distress.  CV:  Good peripheral perfusion.  Regular rate and rhythm  Resp:  Normal effort.  Equal breath  sounds bilaterally.  Abd:  No distention.  Soft, mild diffuse tenderness more so in the left upper quadrant.  ED Results / Procedures / Treatments   RADIOLOGY  I have reviewed interpret the chest x-ray image.  No pneumothorax or significant rib fracture seen on my evaluation. Radiology has read the x-ray as negative.  MEDICATIONS ORDERED IN ED: Medications  sodium chloride  0.9 % bolus 1,000 mL (has no administration in time range)  sodium chloride  0.9 % bolus 500 mL (500 mLs Intravenous New Bag/Given 12/15/23 1727)     IMPRESSION / MDM / ASSESSMENT AND PLAN / ED COURSE  I reviewed the triage vital signs and the nursing notes.  Patient's presentation is most consistent with acute presentation with potential threat to life or bodily function.  Patient presents to the emergency department for nausea vomiting diarrhea starting yesterday worsening throughout the night although he states it is mostly resolved at this point.  Patient's blood pressure on arrival is quite low 75/63 he has received 500 cc of fluid is now up to 118 systolic.  Patient's lab work  today shows a overall reassuring CBC.  Patient's chemistry does show an anion gap of 15 with a bicarb of 21 indicating mild dehydration likely from the patient's nausea and vomiting.  Patient's COVID/flu/RSV is negative, urinalysis is normal.  Rib and chest x-rays are negative.  However given the patient's fall and being on Eliquis  we will obtain CT imaging of the head, given his moderate left upper quadrant tenderness along with his nausea vomiting diarrhea and a fall on Eliquis  we will obtain CT imaging of the abdomen/pelvis with contrast.  We will IV hydrate with additional 1 L of fluid and treat with Zofran  for any residual nausea.  Patient agreeable to plan of care.  Suspect likely gastroenteritis leading to dehydration.  CT scan shows developing enteritis.  Workup otherwise reassuring.  CT scan head is negative for acute abnormality.  Patient  is feeling better after fluids highly suspect gastroenteritis leading to the patient's nausea and vomiting.  We will attempt to ambulate the patient.  As long as he is able to ambulate without any lightheadedness, etc. Will discharge home with continued supportive care including nausea medication short course of pain medication and continue oral fluids hydration.  Patient was able to ambulate using his walker.  Patient states often times at home he will need his wife's assistance when he walks/ambulates.  Patient denied any significant lightheadedness.  Given the patient's initial hypotension I did offer to admit him to the hospital he states he would prefer to go home if possible.  He is still having some pain in his back and in his abdomen CT scan did not show any significant finding however we will discharge with a short course of pain medication nausea medication.  Discussed with the patient to continue oral hydration.   FINAL CLINICAL IMPRESSION(S) / ED DIAGNOSES   Nausea vomiting diarrhea Dehydration  Note:  This document was prepared using Dragon voice recognition software and may include unintentional dictation errors.   Dorothyann Drivers, MD 12/15/23 2251

## 2023-12-15 NOTE — Discharge Instructions (Addendum)
 Please take your pain and nausea medication as needed as written.  Please drink plenty of fluids and follow-up with your doctor tomorrow for recheck/reevaluation.  As we discussed if you have any further episodes of weakness or any falls please return immediately to the emergency department for further treatment and consideration of admission.

## 2023-12-18 ENCOUNTER — Other Ambulatory Visit: Payer: 59

## 2023-12-21 ENCOUNTER — Telehealth: Payer: Self-pay | Admitting: Nurse Practitioner

## 2023-12-21 NOTE — Telephone Encounter (Signed)
Lvm to schedule ED follow up-Toni 

## 2023-12-22 NOTE — Telephone Encounter (Signed)
done

## 2023-12-24 ENCOUNTER — Encounter: Payer: Self-pay | Admitting: Physician Assistant

## 2023-12-24 ENCOUNTER — Ambulatory Visit (INDEPENDENT_AMBULATORY_CARE_PROVIDER_SITE_OTHER): Payer: 59 | Admitting: Physician Assistant

## 2023-12-24 VITALS — BP 113/65 | HR 60 | Temp 97.9°F | Resp 16 | Ht 68.0 in | Wt 167.7 lb

## 2023-12-24 DIAGNOSIS — K529 Noninfective gastroenteritis and colitis, unspecified: Secondary | ICD-10-CM

## 2023-12-24 DIAGNOSIS — R531 Weakness: Secondary | ICD-10-CM

## 2023-12-24 DIAGNOSIS — E1122 Type 2 diabetes mellitus with diabetic chronic kidney disease: Secondary | ICD-10-CM

## 2023-12-24 DIAGNOSIS — N1831 Chronic kidney disease, stage 3a: Secondary | ICD-10-CM

## 2023-12-24 LAB — GLUCOSE, POCT (MANUAL RESULT ENTRY): POC Glucose: 232 mg/dL — AB (ref 70–99)

## 2023-12-24 NOTE — Progress Notes (Signed)
 [facility] [address] [place], [place] [address]  Internal MEDICINE  Office Visit Note  Patient Name: Robert Garcia  [id number]  [id number]  Date of Service: 12/30/2023  Chief Complaint  Patient presents with   Follow-up    HPI Pt is here for ED follow up -went to ED on 12/15/23 with weakness, N/V and 2 falls -N/V started the day prior along with diarrhea and was ongoing overnight. He had 2 falls due to developing weakness from this -At ED he was hypotensive at 79/54 and given 500cc fluids, at recheck systolic was 118 -CXR neg, CT head and abdomen/pelvis also done due to pt being on Eliquis -CT showed developing enteritis, otherwise reassuring and CT neg for acute abnormality -labs indicated dehydration likely due to GI loss, which likely contributed to weakness and falls. Given another 1L fluids and zofran for nausea and was able to discharge to use of his walker to ambulate -Sometimes when he first gets up can still feel a little lightheaded/off balance and takes a moment before trying to walk -taking pain meds as needed since leaving -has not checked sugar lately -Lost his wife in Nov, and is still grieving and adjusting to taking care of himself  Current Medication: Outpatient Encounter Medications as of 12/24/2023  Medication Sig   Accu-Chek Softclix Lancets lancets Use 2x daily. Dx: E11.65.   amiodarone (PACERONE) 200 MG tablet Take 1 tablet (200 mg total) by mouth 2 (two) times daily.   aspirin 325 MG tablet Take 325 mg by mouth daily.   ASPIRIN-CAFFEINE PO Take by mouth as needed. Reported on 04/14/2016   atorvastatin (LIPITOR) 40 MG tablet Take 1 tablet (40 mg total) by mouth daily.   Blood Glucose Monitoring Suppl (ACCU-CHEK GUIDE ME) w/Device KIT Use as directed. Dx: E11.65.   Cholecalciferol (VITAMIN D-3 PO) Take by mouth daily.   ELIQUIS 5 MG TABS tablet Take 1 tablet (5 mg total) by mouth 2 (two) times daily.   FARXIGA 10 MG TABS tablet Take 1  tablet (10 mg total) by mouth daily.   glucose blood (ACCU-CHEK GUIDE TEST) test strip Use 2x daily. Dx: E11.65.   levocetirizine (XYZAL) 5 MG tablet Take 1 tablet (5 mg total) by mouth every evening.   nitroGLYCERIN (NITROSTAT) 0.4 MG SL tablet PLACE 1 TAB UNDER TONGUE FOR CHEST PAIN EVERY FIVE MINUTES--MAX OF 3 DOSES THEN CALL 911 OR GO TO ER   ondansetron (ZOFRAN-ODT) 4 MG disintegrating tablet Take 1 tablet (4 mg total) by mouth every 8 (eight) hours as needed for nausea or vomiting.   oxyCODONE-acetaminophen (PERCOCET) 5-325 MG tablet Take 1 tablet by mouth every 6 (six) hours as needed for moderate pain (pain score 4-6).   ranolazine (RANEXA) 500 MG 12 hr tablet Take 1 tablet (500 mg total) by mouth 2 (two) times daily.   fluticasone (FLONASE) 50 MCG/ACT nasal spray 2 sprays into each nostril two (2) times a day.   No facility-administered encounter medications on file as of 12/24/2023.    Surgical History: Past Surgical History:  Procedure Laterality Date   ABLATION     ablation of atrial fibrillation   CARDIAC CATHETERIZATION  2023   CATARACT EXTRACTION W/PHACO Left 04/14/2016   Procedure: CATARACT EXTRACTION PHACO AND INTRAOCULAR LENS PLACEMENT (IOC);  Surgeon: Sherald Hess, MD;  Location: [facility];  Service: Ophthalmology;  Laterality: Left;   COLONOSCOPY     CORONARY ANGIOPLASTY WITH STENT PLACEMENT  04/2022   HAND SURGERY Left  multiple teeth extracted      Medical History: Past Medical History:  Diagnosis Date   Arteriosclerosis of coronary artery    Arthritis    back, arms   B12 deficiency    Coronary artery disease of native artery of native heart with stable angina pectoris (HCC)    Enlarged prostate    was on Flomax but not taking it   Gastroesophageal reflux disease without esophagitis    Headache(784.0)    left ear stopped up   Hyperlipidemia    takes Trilipix daily   Low back pain    Low back pain with sciatica    Mixed  hyperlipidemia    Paroxysmal atrial fibrillation (HCC)    Pneumonia    hx of;15+yrs ago   Primary hypertension    Seasonal allergies    Stage 3a chronic kidney disease (CKD) (HCC)    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  does NOT wear    Family History: Family History  Problem Relation Age of Onset   Heart disease Mother    Heart disease Father     Social History   Socioeconomic History   Marital status: Married    Spouse name: Not on file   Number of children: Not on file   Years of education: Not on file   Highest education level: Not on file  Occupational History   Not on file  Tobacco Use   Smoking status: Former   Smokeless tobacco: Not on file   Tobacco comments:    quit smoking 63yrs ago  Vaping Use   Vaping status: Never Used  Substance and Sexual Activity   Alcohol use: No   Drug use: No   Sexual activity: Not Currently  Other Topics Concern   Not on file  Social History Narrative   Not on file   Social Drivers of Health   Financial Resource Strain: Low Risk  (02/05/2023)   Received from [facility]   Overall Financial Resource Strain (CARDIA)    Difficulty of Paying Living Expenses: Not hard at all  Food Insecurity: No Food Insecurity (02/05/2023)   Received from [facility]   Hunger Vital Sign    Worried About Running Out of Food in the Last Year: Never true    Ran Out of Food in the Last Year: Never true  Transportation Needs: No Transportation Needs (02/05/2023)   Received from [facility]   PRAPARE - Transportation    Lack of Transportation (Medical): No    Lack of Transportation (Non-Medical): No  Physical Activity: Insufficiently Active (12/31/2022)   Received from [facility]   Exercise Vital Sign    Days of Exercise per Week: 2 days    Minutes of Exercise per Session: 30 min   Stress: No Stress Concern Present (12/31/2022)   Received from [facility] - Occupational Stress Questionnaire    Feeling of Stress : Not at all  Social Connections: Socially Integrated (12/31/2022)   Received from [facility]   Social Connection and Isolation Panel [NHANES]    Frequency of Communication with Friends and Family: More than three times a week    Frequency of Social Gatherings with Friends and Family: More than three times a  week    Attends Religious Services: More than 4 times per year    Active Member of Clubs or Organizations: Yes    Attends Banker Meetings: More than 4 times per year    Marital Status: Married  Catering manager Violence: Not on file      Review of Systems  Constitutional:  Positive for fatigue.  HENT:  Negative for postnasal drip.   Respiratory:  Negative for cough.   Gastrointestinal:  Negative for abdominal pain, diarrhea and vomiting.  Genitourinary:  Negative for dysuria.  Musculoskeletal:  Positive for arthralgias and back pain.  Neurological:  Positive for weakness and light-headedness.  Psychiatric/Behavioral:  Positive for dysphoric mood.     Vital Signs: BP 113/65   Pulse 60   Temp 97.9 F (36.6 C)   Resp 16   Ht 5\' 8"  (1.727 m)   Wt 167 lb 11.2 oz (76.1 kg)   SpO2 98%   BMI 25.50 kg/m    Physical Exam Vitals and nursing note reviewed.  Constitutional:      Appearance: Normal appearance.  HENT:     Head: Normocephalic and atraumatic.  Cardiovascular:     Rate and Rhythm: Normal rate and regular rhythm.  Pulmonary:     Effort: Pulmonary effort is normal.     Breath sounds: Normal breath sounds.  Skin:    General: Skin is warm and dry.  Neurological:     Mental Status: He is alert.  Psychiatric:        Mood and Affect: Mood normal.        Behavior: Behavior normal.        Assessment/Plan: 1. Weakness generalized (Primary) Improving since recent  illness, advised to continue to hydrate and eat at regular intervals. Utilize walking device as needed for safety  2. Gastroenteritis Resolved, continue to rest and hydrate. Has labs already ordered for CPE and will get these done now  3. Type 2 diabetes mellitus with stage 3a chronic kidney disease, without long-term current use of insulin (HCC) - POCT Glucose (CBG) is 232, elevated based on last meal. Discussed increasing water intake and admits to not drinking much today. Will do better about monitoring BG and making diet choices   General Counseling: jairon ripberger understanding of the findings of todays visit and agrees with plan of treatment. I have discussed any further diagnostic evaluation that may be needed or ordered today. We also reviewed his medications today. he has been encouraged to call the office with any questions or concerns that should arise related to todays visit.    Orders Placed This Encounter  Procedures   POCT Glucose (CBG)    No orders of the defined types were placed in this encounter.   This patient was seen by Lynn Ito, PA-C in collaboration with Dr. Beverely Risen as a part of collaborative care agreement.   Total time spent:30 Minutes Time spent includes review of chart, medications, test results, and follow up plan with the patient.      Dr Lyndon Code Internal medicine

## 2023-12-30 NOTE — Progress Notes (Deleted)
 Cardiology Office Note:  .   Date:  12/30/2023  ID:  Robert Slim Sr., DOB 02/09/1946, MRN [id number] PCP: Sallyanne Kuster, NP  [facility] Providers Cardiologist:  Debbe Odea, MD { Click to update primary MD,subspecialty MD or APP then REFRESH:1}   History of Present Illness: Robert Slim Sr. is a 78 y.o. male with a past medical history of paroxysmal atrial fibrillation status post ablation (2022), hyperlipidemia, diabetes, who presents today for follow-up.   Previously followed by [facility] from the cardiac perspective and Dr. Welton Flakes (last visit 10/29/2023).  He had been diagnosed with atrial fibrillation approximately 2 years prior and underwent ablation procedure.  He was started on apixaban which she has been taking since that time.  He does have been maintained on amiodarone and digoxin.  He had started to have issues of not thinking clearly he attributed to the recent loss of his wife.  He states that he occasionally has chest discomfort and states that he underwent an ischemic evaluation last year with previous cardiologist and was advised that everything was okay.  He did state that he takes sublingual nitro and aspirin to help with his occasional chest discomfort.   He was last seen in clinic 10/30/2023 by Dr. Azucena Cecil.  At that time his EKG revealed sinus bradycardia with a rate of 53.  His digoxin was discontinued and amiodarone was reduced to 200 mg daily.  He was also scheduled for an echocardiogram.  He returns to clinic today  ROS: 10 point review of systems has been reviewed and considered negative with exception of what is been listed in the HPI  Studies Reviewed: .       2D echo 12/10/2023 1. Left ventricular ejection fraction, by estimation, is 60 to 65%. The  left ventricle has normal function. The left ventricle has no regional  wall motion abnormalities. There is mild left ventricular hypertrophy.  Left ventricular diastolic  parameters  were normal.   2. Right ventricular systolic function is normal. The right ventricular  size is normal. There is normal pulmonary artery systolic pressure. The  estimated right ventricular systolic pressure is 33.1 mmHg.   3. The mitral valve is normal in structure. Mild mitral valve  regurgitation. No evidence of mitral stenosis.   4. The aortic valve is normal in structure. Aortic valve regurgitation is  mild. No aortic stenosis is present.   5. The inferior vena cava is normal in size with greater than 50%  respiratory variability, suggesting right atrial pressure of 3 mmHg.   Risk Assessment/Calculations:    CHA2DS2-VASc Score =    {Click here to calculate score.  REFRESH note before signing. :1} This indicates a  % annual risk of stroke. The patient's score is based upon:      No BP recorded.  {Refresh Note OR Click here to enter BP  :1}***       Physical Exam:   VS:  There were no vitals taken for this visit.   Wt Readings from Last 3 Encounters:  12/24/23 167 lb 11.2 oz (76.1 kg)  12/15/23 167 lb 8.8 oz (76 kg)  12/09/23 167 lb 9.6 oz (76 kg)    GEN: Well nourished, well developed in no acute distress NECK: No JVD; No carotid bruits CARDIAC: ***RRR, no murmurs, rubs, gallops RESPIRATORY:  Clear to auscultation without rales, wheezing or rhonchi  ABDOMEN: Soft, non-tender, non-distended EXTREMITIES:  No edema; No deformity   ASSESSMENT AND PLAN: .  Paroxysmal atrial fibrillation Pure hypercholesterolemia Atypical chest pain    {Are you ordering a CV Procedure (e.g. stress test, cath, DCCV, TEE, etc)?   Press F2        :[id number]}  Dispo: ***  Signed, Sheehan Stacey, NP

## 2023-12-31 ENCOUNTER — Encounter: Payer: Self-pay | Admitting: Cardiology

## 2023-12-31 ENCOUNTER — Ambulatory Visit: Payer: 59 | Admitting: Cardiology

## 2023-12-31 ENCOUNTER — Ambulatory Visit: Payer: 59 | Attending: Cardiology | Admitting: Cardiology

## 2023-12-31 VITALS — BP 107/49 | Ht 68.0 in | Wt 167.8 lb

## 2023-12-31 DIAGNOSIS — E1142 Type 2 diabetes mellitus with diabetic polyneuropathy: Secondary | ICD-10-CM | POA: Diagnosis not present

## 2023-12-31 DIAGNOSIS — E7800 Pure hypercholesterolemia, unspecified: Secondary | ICD-10-CM | POA: Diagnosis not present

## 2023-12-31 DIAGNOSIS — I480 Paroxysmal atrial fibrillation: Secondary | ICD-10-CM

## 2023-12-31 DIAGNOSIS — R079 Chest pain, unspecified: Secondary | ICD-10-CM

## 2023-12-31 DIAGNOSIS — Z794 Long term (current) use of insulin: Secondary | ICD-10-CM

## 2023-12-31 MED ORDER — ASPIRIN 81 MG PO TBEC: 81.0000 mg | DELAYED_RELEASE_TABLET | Freq: Every day | ORAL | Status: AC

## 2023-12-31 NOTE — Progress Notes (Signed)
 Cardiology Office Note:  .   Date:  12/31/2023  ID:  Robert Slim Sr., DOB Jun 05, 1946, MRN [id number] PCP: Sallyanne Kuster, NP  [facility] Providers Cardiologist:  Debbe Odea, MD    History of Present Illness: Robert Slim Sr. is a 78 y.o. male with a past medical history of paroxysmal atrial fibrillation status post ablation (2022), hyperlipidemia, diabetes, who presents today for follow-up.   Previously followed by [facility] from the cardiac perspective and Dr. Welton Flakes (last visit 10/29/2023).  He had been diagnosed with atrial fibrillation approximately 2 years prior and underwent ablation procedure.  He was started on apixaban which she has been taking since that time.  He does have been maintained on amiodarone and digoxin.  He had started to have issues of not thinking clearly he attributed to the recent loss of his wife.  He states that he occasionally has chest discomfort and states that he underwent an ischemic evaluation last year with previous cardiologist and was advised that everything was okay.  He did state that he takes sublingual nitro and aspirin to help with his occasional chest discomfort.   He was last seen in clinic 10/30/2023 by Dr. Azucena Cecil.  At that time his EKG revealed sinus bradycardia with a rate of 53.  His digoxin was discontinued and amiodarone was reduced to 200 mg daily.  He was also scheduled for an echocardiogram.  Patient was evaluated in the emergency department 2/4/Orth[facility] with weakness, nausea vomiting and 2 falls.  He was found to be hypotensive with a blood pressure 79/54 was given 500 cc of normal saline and recheck for systolic was 118.  Chest x-ray was negative.  CT of the head abdomen pelvis was also done due to patient being on apixaban.  CT showed developing enteritis otherwise reassuring and CT negative for acute abnormality.  Labs indicated dehydration likely due to GI losses which contributed to his weakness  and falls.  He was given another liter of IV fluids as well as Zofran for nausea and was able to be discharged.   He returns to clinic today stating that from the cardiac perspective that he has been doing well.  He denies any chest pain, shortness of breath, palpitations.  He occasionally has some peripheral edema to his lower extremities that he relates to his neuropathy.  He states that he continues to suffer from back pain.  Was recently evaluated in the emergency department for gastroenteritis and has been trying to increase his fluid intake since that time.  States that he has been compliant with his apixaban and has not missed any doses.  He denies any bleeding with no blood noted in his urine or stool.  Stated that he had previously called and rescheduled his appointment and his [place] until the roads were fine so he had called and His appointment for today.  But overall he has no new concerns.  ROS: 10 point review of systems has been reviewed and considered negative with exception of what is been listed in the HPI  Studies Reviewed: Marland Kitchen   EKG Interpretation Date/Time:  Thursday December 31 2023 13:23:51 EST Ventricular Rate:  56 PR Interval:  162 QRS Duration:  116 QT Interval:  474 QTC Calculation: 457 R Axis:   -40  Text Interpretation: Sinus bradycardia Left axis deviation When compared with ECG of 30-Oct-2023 10:04, Confirmed by Charlsie Quest ([id number]) on 12/31/2023 1:25:35 PM    2D echo 12/10/2023 1. Left ventricular  ejection fraction, by estimation, is 60 to 65%. The  left ventricle has normal function. The left ventricle has no regional  wall motion abnormalities. There is mild left ventricular hypertrophy.  Left ventricular diastolic parameters  were normal.   2. Right ventricular systolic function is normal. The right ventricular  size is normal. There is normal pulmonary artery systolic pressure. The  estimated right ventricular systolic pressure is 33.1 mmHg.   3. The  mitral valve is normal in structure. Mild mitral valve  regurgitation. No evidence of mitral stenosis.   4. The aortic valve is normal in structure. Aortic valve regurgitation is  mild. No aortic stenosis is present.   5. The inferior vena cava is normal in size with greater than 50%  respiratory variability, suggesting right atrial pressure of 3 mmHg.   Risk Assessment/Calculations:    CHA2DS2-VASc Score = 3   This indicates a 3.2% annual risk of stroke. The patient's score is based upon: CHF History: 0 HTN History: 0 Diabetes History: 1 Stroke History: 0 Vascular Disease History: 0 Age Score: 2 Gender Score: 0            Physical Exam:   VS:  BP (!) 107/49   Ht 5\' 8"  (1.727 m)   Wt 167 lb 12.8 oz (76.1 kg)   SpO2 98%   BMI 25.51 kg/m    Wt Readings from Last 3 Encounters:  12/31/23 167 lb 12.8 oz (76.1 kg)  12/24/23 167 lb 11.2 oz (76.1 kg)  12/15/23 167 lb 8.8 oz (76 kg)    GEN: Well nourished, well developed in no acute distress NECK: No JVD; No carotid bruits CARDIAC: RRR, no murmurs, rubs, gallops RESPIRATORY:  Clear to auscultation without rales, wheezing or rhonchi  ABDOMEN: Soft, non-tender, non-distended EXTREMITIES:  No edema; No deformity   ASSESSMENT AND PLAN: .   Paroxysmal atrial fibrillation rate is currently send bradycardia with rate of 56 left axis deviation noted on EKG today.  States that he denies any palpitations or recurrence of his atrial fibrillation.  Has been continued on amiodarone 200 mg daily, apixaban 5 mg twice daily for CHA2DS2-VASc of at least 3 for stroke prophylaxis.  He has previously been instructed that he could stop with aspirin therapy but declines wearing this time.  With recent gastroenteritis issues and occasional abdominal discomfort he has been advised to decrease his full-strength aspirin to 81 mg aspirin daily.  Pure hypercholesterolemia with unknown LDL at this time.  He is continued on atorvastatin 40 mg daily with a  lipid panel ordered.  Atypical chest pain that has resolved.  He thinks that when he has chest discomfort that is coming from his chronic back pain.  He is continued on aspirin 81 mg daily as he declined stopping aspirin therapy.  Lipitor 40 mg daily and Ranexa.  Recent echocardiogram revealed an LVEF of 60 to 65%, no regional wall motion abnormalities and mild MR.  Diabetes with neuropathy.  This continues to be maintained by his PCP.  He continues on Farxiga 10 mg daily.       Dispo: Patient return to clinic with MD/APP in 3 months or sooner if needed for evaluation of symptoms.  Signed, Che Below, NP

## 2023-12-31 NOTE — Patient Instructions (Signed)
 Medication Instructions:  DECREASE the Aspirin to 81 mg once daily  *If you need a refill on your cardiac medications before your next appointment, please call your pharmacy*   Lab Work: None ordered If you have labs (blood work) drawn today and your tests are completely normal, you will receive your results only by: MyChart Message (if you have MyChart) OR A paper copy in the mail If you have any lab test that is abnormal or we need to change your treatment, we will call you to review the results.   Testing/Procedures: None ordered   Follow-Up: At [facility], you and your health needs are our priority.  As part of our continuing mission to provide you with exceptional heart care, we have created designated Provider Care Teams.  These Care Teams include your primary Cardiologist (physician) and Advanced Practice Providers (APPs -  Physician Assistants and Nurse Practitioners) who all work together to provide you with the care you need, when you need it.  We recommend signing up for the patient portal called "MyChart".  Sign up information is provided on this After Visit Summary.  MyChart is used to connect with patients for Virtual Visits (Telemedicine).  Patients are able to view lab/test results, encounter notes, upcoming appointments, etc.  Non-urgent messages can be sent to your provider as well.   To learn more about what you can do with MyChart, go to [email or web address].    Your next appointment:   3 month(s)  Provider:   You may see Debbe Odea, MD or one of the following Advanced Practice Providers on your designated Care Team:   Charlsie Quest, NP

## 2024-01-05 ENCOUNTER — Ambulatory Visit: Payer: 59 | Admitting: Nurse Practitioner

## 2024-01-05 ENCOUNTER — Other Ambulatory Visit
Admission: RE | Admit: 2024-01-05 | Discharge: 2024-01-05 | Disposition: A | Discharge status: Home / Self Care | Payer: 59 | Source: Ambulatory Visit | Attending: Nurse Practitioner | Admitting: Nurse Practitioner

## 2024-01-05 ENCOUNTER — Encounter: Payer: Self-pay | Admitting: Nurse Practitioner

## 2024-01-05 VITALS — BP 100/64 | HR 62 | Temp 97.8°F | Resp 16 | Ht 68.0 in | Wt 164.8 lb

## 2024-01-05 DIAGNOSIS — E538 Deficiency of other specified B group vitamins: Secondary | ICD-10-CM | POA: Diagnosis not present

## 2024-01-05 DIAGNOSIS — E1122 Type 2 diabetes mellitus with diabetic chronic kidney disease: Secondary | ICD-10-CM | POA: Diagnosis present

## 2024-01-05 DIAGNOSIS — I129 Hypertensive chronic kidney disease with stage 1 through stage 4 chronic kidney disease, or unspecified chronic kidney disease: Secondary | ICD-10-CM | POA: Insufficient documentation

## 2024-01-05 DIAGNOSIS — N1831 Chronic kidney disease, stage 3a: Secondary | ICD-10-CM

## 2024-01-05 DIAGNOSIS — Z0000 Encounter for general adult medical examination without abnormal findings: Secondary | ICD-10-CM

## 2024-01-05 DIAGNOSIS — D692 Other nonthrombocytopenic purpura: Secondary | ICD-10-CM | POA: Diagnosis not present

## 2024-01-05 DIAGNOSIS — I4892 Unspecified atrial flutter: Secondary | ICD-10-CM | POA: Diagnosis not present

## 2024-01-05 DIAGNOSIS — Z125 Encounter for screening for malignant neoplasm of prostate: Secondary | ICD-10-CM | POA: Insufficient documentation

## 2024-01-05 LAB — LIPID PANEL
Cholesterol: 121 mg/dL (ref 0–200)
HDL: 46 mg/dL (ref >40)
LDL Cholesterol: 41 mg/dL (ref 0–99)
Total CHOL/HDL Ratio: 2.6 ratio
Triglycerides: 171 mg/dL — ABNORMAL HIGH (ref <150)
VLDL: 34 mg/dL (ref 0–40)

## 2024-01-05 LAB — COMPREHENSIVE METABOLIC PANEL
ALT: 100 U/L — ABNORMAL HIGH (ref 0–44)
AST: 71 U/L — ABNORMAL HIGH (ref 15–41)
Albumin: 3.8 g/dL (ref 3.5–5.0)
Alkaline Phosphatase: 154 U/L — ABNORMAL HIGH (ref 38–126)
Anion gap: 9 (ref 5–15)
BUN: 22 mg/dL (ref 8–23)
CO2: 25 mmol/L (ref 22–32)
Calcium: 9.1 mg/dL (ref 8.9–10.3)
Chloride: 102 mmol/L (ref 98–111)
Creatinine, Ser: 1.57 mg/dL — ABNORMAL HIGH (ref 0.61–1.24)
GFR, Estimated: 46 mL/min — ABNORMAL LOW (ref >60)
Glucose, Bld: 214 mg/dL — ABNORMAL HIGH (ref 70–99)
Potassium: 4.6 mmol/L (ref 3.5–5.1)
Sodium: 136 mmol/L (ref 135–145)
Total Bilirubin: 0.7 mg/dL (ref 0.0–1.2)
Total Protein: 6.9 g/dL (ref 6.5–8.1)

## 2024-01-05 LAB — FOLATE: Folate: 25 ng/mL (ref >5.9)

## 2024-01-05 LAB — CBC WITH DIFFERENTIAL/PLATELET
Abs Immature Granulocytes: 0.04 10*3/uL (ref 0.00–0.07)
Basophils Absolute: 0.1 10*3/uL (ref 0.0–0.1)
Basophils Relative: 1 %
Eosinophils Absolute: 0.1 10*3/uL (ref 0.0–0.5)
Eosinophils Relative: 2 %
HCT: 39.3 % (ref 39.0–52.0)
Hemoglobin: 12.9 g/dL — ABNORMAL LOW (ref 13.0–17.0)
Immature Granulocytes: 1 %
Lymphocytes Relative: 20 %
Lymphs Abs: 1.2 10*3/uL (ref 0.7–4.0)
MCH: 31.5 pg (ref 26.0–34.0)
MCHC: 32.8 g/dL (ref 30.0–36.0)
MCV: 96.1 fL (ref 80.0–100.0)
Monocytes Absolute: 0.7 10*3/uL (ref 0.1–1.0)
Monocytes Relative: 11 %
Neutro Abs: 4 10*3/uL (ref 1.7–7.7)
Neutrophils Relative %: 65 %
Platelets: 197 10*3/uL (ref 150–400)
RBC: 4.09 MIL/uL — ABNORMAL LOW (ref 4.22–5.81)
RDW: 13.8 % (ref 11.5–15.5)
WBC: 6.1 10*3/uL (ref 4.0–10.5)
nRBC: 0 % (ref 0.0–0.2)

## 2024-01-05 LAB — T4, FREE: Free T4: 1.04 ng/dL (ref 0.61–1.12)

## 2024-01-05 LAB — TSH: TSH: 2.424 u[IU]/mL (ref 0.350–4.500)

## 2024-01-05 LAB — VITAMIN B12: Vitamin B-12: 554 pg/mL (ref 180–914)

## 2024-01-05 NOTE — Progress Notes (Signed)
 [facility] [address] [place], [place] [address]  Internal MEDICINE  Office Visit Note  Patient Name: Robert Garcia  [id number]  [id number]  Date of Service: 01/05/2024  Chief Complaint  Patient presents with   Diabetes   Gastroesophageal Reflux   Hyperlipidemia   Hypertension   Medicare Wellness    HPI Robert Garcia presents for a medicare annual wellness visit.  Well-appearing 78 y.o. male with  diabetes, CAD, HF, stage 3a CKD, neuropathy of both legs, arthritis in back and paroxysmal atrial flutter  Routine CRC screening: cologuard due in 2026 Eye exam: goes to [facility], needs to call them  foot exam: done  Labs: has lab orders, reminded patient to have labs done  New or worsening pain: chronic pain, nothing new  Other concerns: still having some depression and grieving regarding passing of his wife last year.       01/05/2024    1:08 PM  MMSE - Mini Mental State Exam  Orientation to time 5  Orientation to Place 5  Registration 0  Attention/ Calculation 5  Recall 3  Language- name 2 objects 2  Language- repeat 1  Language- follow 3 step command 3  Language- read & follow direction 1  Write a sentence 0  Copy design 0  Total score 25    Functional Status Survey: Is the patient deaf or have difficulty hearing?: No Does the patient have difficulty seeing, even when wearing glasses/contacts?: Yes Does the patient have difficulty concentrating, remembering, or making decisions?: Yes Does the patient have difficulty walking or climbing stairs?: Yes Does the patient have difficulty dressing or bathing?: Yes Does the patient have difficulty doing errands alone such as visiting a doctor's office or shopping?: Yes     07/30/2023    1:57 PM 01/05/2024    1:05 PM  Fall Risk  Falls in the past year? 0 1  Was there an injury with Fall? 0 1  Fall Risk Category Calculator 0 3  Patient at Risk for Falls Due to No Fall Risks   Fall risk Follow  up Falls evaluation completed Falls evaluation completed       01/05/2024    1:33 PM  Depression screen PHQ 2/9  Decreased Interest 2  Down, Depressed, Hopeless 3  PHQ - 2 Score 5  Altered sleeping 3  Tired, decreased energy 2  Change in appetite 0  Feeling bad or failure about yourself  1  Trouble concentrating 2  Moving slowly or fidgety/restless 1  Suicidal thoughts 0  PHQ-9 Score 14  Difficult doing work/chores Very difficult       01/05/2024    1:38 PM  GAD 7 : Generalized Anxiety Score  Nervous, Anxious, on Edge 1  Control/stop worrying 0  Worry too much - different things 0  Trouble relaxing 1  Restless 1  Easily annoyed or irritable 1  Afraid - awful might happen 1  Total GAD 7 Score 5  Anxiety Difficulty Not difficult at all        Current Medication: Outpatient Encounter Medications as of 01/05/2024  Medication Sig   Accu-Chek Softclix Lancets lancets Use 2x daily. Dx: E11.65.   amiodarone (PACERONE) 200 MG tablet Take 1 tablet (200 mg total) by mouth 2 (two) times daily.   aspirin EC 81 MG tablet Take 1 tablet (81 mg total) by mouth daily. Swallow whole.   ASPIRIN-CAFFEINE PO Take by mouth as needed. Reported on 04/14/2016   atorvastatin (LIPITOR) 40  MG tablet Take 1 tablet (40 mg total) by mouth daily.   Blood Glucose Monitoring Suppl (ACCU-CHEK GUIDE ME) w/Device KIT Use as directed. Dx: E11.65.   Cholecalciferol (VITAMIN D-3 PO) Take by mouth daily.   ELIQUIS 5 MG TABS tablet Take 1 tablet (5 mg total) by mouth 2 (two) times daily.   FARXIGA 10 MG TABS tablet Take 1 tablet (10 mg total) by mouth daily.   glucose blood (ACCU-CHEK GUIDE TEST) test strip Use 2x daily. Dx: E11.65.   levocetirizine (XYZAL) 5 MG tablet Take 1 tablet (5 mg total) by mouth every evening.   nitroGLYCERIN (NITROSTAT) 0.4 MG SL tablet PLACE 1 TAB UNDER TONGUE FOR CHEST PAIN EVERY FIVE MINUTES--MAX OF 3 DOSES THEN CALL 911 OR GO TO ER   ondansetron (ZOFRAN-ODT) 4 MG disintegrating  tablet Take 1 tablet (4 mg total) by mouth every 8 (eight) hours as needed for nausea or vomiting.   oxyCODONE-acetaminophen (PERCOCET) 5-325 MG tablet Take 1 tablet by mouth every 6 (six) hours as needed for moderate pain (pain score 4-6).   ranolazine (RANEXA) 500 MG 12 hr tablet Take 1 tablet (500 mg total) by mouth 2 (two) times daily.   fluticasone (FLONASE) 50 MCG/ACT nasal spray 2 sprays into each nostril two (2) times a day.   [DISCONTINUED] aspirin 325 MG tablet Take 325 mg by mouth daily.   No facility-administered encounter medications on file as of 01/05/2024.    Surgical History: Past Surgical History:  Procedure Laterality Date   ABLATION     ablation of atrial fibrillation   CARDIAC CATHETERIZATION  2023   CATARACT EXTRACTION W/PHACO Left 04/14/2016   Procedure: CATARACT EXTRACTION PHACO AND INTRAOCULAR LENS PLACEMENT (IOC);  Surgeon: Sherald Hess, MD;  Location: [facility];  Service: Ophthalmology;  Laterality: Left;   COLONOSCOPY     CORONARY ANGIOPLASTY WITH STENT PLACEMENT  04/2022   HAND SURGERY Left    multiple teeth extracted      Medical History: Past Medical History:  Diagnosis Date   Arteriosclerosis of coronary artery    Arthritis    back, arms   B12 deficiency    Coronary artery disease of native artery of native heart with stable angina pectoris (HCC)    Enlarged prostate    was on Flomax but not taking it   Gastroesophageal reflux disease without esophagitis    Headache(784.0)    left ear stopped up   Hyperlipidemia    takes Trilipix daily   Low back pain    Low back pain with sciatica    Mixed hyperlipidemia    Paroxysmal atrial fibrillation (HCC)    Pneumonia    hx of;15+yrs ago   Primary hypertension    Seasonal allergies    Stage 3a chronic kidney disease (CKD) (HCC)    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  does NOT wear    Family History: Family History  Problem Relation Age of Onset   Heart disease Mother    Heart disease Father     Social History   Socioeconomic History   Marital status: Married    Spouse name: Not on file   Number of children: Not on file   Years of education: Not on file   Highest education level: Not on file  Occupational History   Not on file  Tobacco Use   Smoking status: Former   Smokeless tobacco: Not on file   Tobacco comments:    quit smoking 40yrs ago  Vaping Use   Vaping status: Never Used  Substance and Sexual Activity   Alcohol use: No   Drug use: No   Sexual activity: Not Currently  Other Topics Concern   Not on file  Social History Narrative   Not on file   Social Drivers of Health   Financial Resource Strain: Low Risk  (02/05/2023)   Received from [facility]   Overall Financial Resource Strain (CARDIA)    Difficulty of Paying Living Expenses: Not hard at all  Food Insecurity: No Food Insecurity (02/05/2023)   Received from [facility]   Hunger Vital Sign    Worried About Running Out of Food in the Last Year: Never true    Ran Out of Food in the Last Year: Never true  Transportation Needs: No Transportation Needs (02/05/2023)   Received from [facility]   PRAPARE - Transportation    Lack of Transportation (Medical): No    Lack of Transportation (Non-Medical): No  Physical Activity: Insufficiently Active (12/31/2022)   Received from [facility]   Exercise Vital Sign    Days of Exercise per Week: 2 days    Minutes of Exercise per Session: 30 min  Stress: No Stress Concern Present (12/31/2022)   Received from [facility] - Occupational Stress Questionnaire    Feeling of Stress : Not at all  Social Connections: Socially Integrated (12/31/2022)   Received from [facility]   Social Connection and Isolation Panel [NHANES]     Frequency of Communication with Friends and Family: More than three times a week    Frequency of Social Gatherings with Friends and Family: More than three times a week    Attends Religious Services: More than 4 times per year    Active Member of [employer] or Organizations: Yes    Attends Engineer, structural: More than 4 times per year    Marital Status: Married  Catering manager Violence: Not on file      Review of Systems  Constitutional:  Positive for activity change and fatigue.  HENT: Negative.    Respiratory:  Positive for shortness of breath. Negative for cough, chest tightness and wheezing.   Cardiovascular: Negative.  Negative for chest pain and palpitations.  Gastrointestinal: Negative.   Genitourinary: Negative.   Musculoskeletal:  Positive for arthralgias and back pain.  Neurological: Negative.   Hematological:  Bruises/bleeds easily.  Psychiatric/Behavioral:  Positive for behavioral problems and decreased concentration. Negative for self-injury, sleep disturbance and suicidal ideas. The patient is nervous/anxious.     Vital Signs: BP 100/64   Pulse 62   Temp 97.8 F (36.6 C)   Resp 16   Ht 5\' 8"  (1.727 m)   Wt 164 lb 12.8 oz (74.8 kg)   SpO2 95%   BMI 25.06 kg/m    Physical Exam Vitals reviewed.  Constitutional:      General: He is not in acute distress.    Appearance: Normal appearance. He is not ill-appearing.  HENT:     Head: Normocephalic and atraumatic.  Eyes:     Pupils: Pupils are equal, round, and reactive to light.  Cardiovascular:     Rate and Rhythm: Normal rate and regular rhythm.  Pulmonary:  Effort: Pulmonary effort is normal. No respiratory distress.  Neurological:     Mental Status: He is alert and oriented to person, place, and time.  Psychiatric:        Mood and Affect: Mood normal.        Behavior: Behavior normal.        Assessment/Plan: 1. Encounter for subsequent annual wellness visit (AWV) in Medicare patient  (Primary) Age-appropriate preventive screenings and vaccinations discussed, annual physical exam completed. Routine labs for health maintenance have previously been ordered, reminded patient to have his labs drawn. PHM updated.     2. Type 2 diabetes mellitus with stage 3a chronic kidney disease, without long-term current use of insulin (HCC) Taking farxiga as prescribed   3. Hypertension associated with stage 3a chronic kidney disease due to type 2 diabetes mellitus (HCC) Stable, continue current medications as prescribed.   4. Stage 3a chronic kidney disease (CKD) (HCC) Continue farxiga as prescribed.    5. Senile purpura (HCC) No interventions at this time, continue to monitor.       General Counseling: neiko trivedi understanding of the findings of todays visit and agrees with plan of treatment. I have discussed any further diagnostic evaluation that may be needed or ordered today. We also reviewed his medications today. he has been encouraged to call the office with any questions or concerns that should arise related to todays visit.    No orders of the defined types were placed in this encounter.   No orders of the defined types were placed in this encounter.   Return in about 2 months (around 03/04/2024) for F/U, Recheck A1C, Genasis Zingale PCP -- need urine ACR.   Total time spent:30 Minutes Time spent includes review of chart, medications, test results, and follow up plan with the patient.   [place] Controlled Substance Database was reviewed by me.  This patient was seen by Sallyanne Kuster, FNP-C in collaboration with Dr. Beverely Risen as a part of collaborative care agreement.  Tyric Rodeheaver R. Tedd Sias, MSN, FNP-C Internal medicine

## 2024-01-06 LAB — PSA (REFLEX TO FREE) (SERIAL): Prostate Specific Ag, Serum: 1 ng/mL (ref 0.0–4.0)

## 2024-01-16 ENCOUNTER — Encounter: Payer: Self-pay | Admitting: Nurse Practitioner

## 2024-01-29 ENCOUNTER — Ambulatory Visit: Payer: 59

## 2024-01-29 DIAGNOSIS — I8390 Asymptomatic varicose veins of unspecified lower extremity: Secondary | ICD-10-CM

## 2024-01-29 DIAGNOSIS — Z794 Long term (current) use of insulin: Secondary | ICD-10-CM

## 2024-01-29 DIAGNOSIS — M2141 Flat foot [pes planus] (acquired), right foot: Secondary | ICD-10-CM

## 2024-01-29 NOTE — Progress Notes (Signed)
 Patient presents to the office today for diabetic shoe and insole measuring.  Patient was measured with brannock device to determine size and width for 1 pair of extra depth shoes and foam casted for 3 pair of insoles.   Documentation of medical necessity will be sent to patient's treating diabetic doctor to verify and sign.   Patient's diabetic provider: Beverely Risen MD oversees Robert Kuster NP  Shoes and insoles will be ordered at that time and patient will be notified for an appointment for fitting when they arrive.   Shoe size (per patient): 8.5-9 Brannock measurement: 9 Shoe choice:   585 / A5000M Shoe size ordered: 9WD  Ppw / ABN signed

## 2024-02-12 ENCOUNTER — Ambulatory Visit: Payer: 59 | Admitting: Cardiology

## 2024-02-29 ENCOUNTER — Telehealth: Payer: Self-pay

## 2024-02-29 NOTE — Telephone Encounter (Signed)
 Patient called LM on status of DM shoes, We have not recevd ppw from Dr Meredeth Stallion, who oversees Laurence Pons NP. I called patient and LM explaining we need that back in order for Safe step to release shoes and inserts the Fax number we have been faxing to is [phone] also left this fax number in message to patient.  Britton Cane Cped, CFo, CFm

## 2024-03-08 ENCOUNTER — Telehealth: Payer: Self-pay | Admitting: Podiatry

## 2024-03-08 ENCOUNTER — Ambulatory Visit: Payer: 59 | Admitting: Nurse Practitioner

## 2024-03-08 NOTE — Telephone Encounter (Signed)
 Pt called sounded confused about message left regarding Dr. Meredeth Stallion. After I read your message to him, he stated he really did not know what to do and asked if I had Dr. Saralee Cummins number. He stated that he was was not doing to well and wife use to handle all this but passed in Nov. I called (merged him in) Dr. Lilton Relic office and Brant Caldron stated that they have not received.  Can you please fax back to [place] or Nimisha Attn: to [phone]. She see that Dr. Meredeth Stallion is  OOF until 5/13 if you need the MD signature. If NP, Authur Leghorn can sign, please not on fax and they will have it taken care of.   Thank you.

## 2024-03-10 ENCOUNTER — Telehealth: Payer: Self-pay

## 2024-03-10 NOTE — Telephone Encounter (Signed)
 Recvd message from Dr office with different fax number to be faxed to and attn: Archie Bearded or Brant Caldron  Faxed today / success

## 2024-03-23 ENCOUNTER — Telehealth: Payer: Self-pay

## 2024-03-23 NOTE — Telephone Encounter (Signed)
 Faxed diabetic shoe with signature [phone] [facility]

## 2024-03-24 ENCOUNTER — Ambulatory Visit: Admitting: Nurse Practitioner

## 2024-03-24 VITALS — BP 109/62 | HR 55 | Temp 98.4°F | Resp 16 | Ht 68.0 in | Wt 165.2 lb

## 2024-03-24 DIAGNOSIS — I4892 Unspecified atrial flutter: Secondary | ICD-10-CM | POA: Diagnosis not present

## 2024-03-24 DIAGNOSIS — N1831 Chronic kidney disease, stage 3a: Secondary | ICD-10-CM | POA: Diagnosis not present

## 2024-03-24 DIAGNOSIS — R600 Localized edema: Secondary | ICD-10-CM

## 2024-03-24 DIAGNOSIS — E1122 Type 2 diabetes mellitus with diabetic chronic kidney disease: Secondary | ICD-10-CM

## 2024-03-24 DIAGNOSIS — D692 Other nonthrombocytopenic purpura: Secondary | ICD-10-CM

## 2024-03-24 DIAGNOSIS — I129 Hypertensive chronic kidney disease with stage 1 through stage 4 chronic kidney disease, or unspecified chronic kidney disease: Secondary | ICD-10-CM

## 2024-03-24 LAB — POCT GLYCOSYLATED HEMOGLOBIN (HGB A1C): Hemoglobin A1C: 6.8 % — AB (ref 4.0–5.6)

## 2024-03-24 NOTE — Progress Notes (Signed)
 [facility] [address] [place], [place] [address]  Internal MEDICINE  Office Visit Note  Patient Name: Robert Garcia  [id number]  [id number]  Date of Service: 03/24/2024  Chief Complaint  Patient presents with   Diabetes   Hypertension   Hyperlipidemia   Gastroesophageal Reflux   Follow-up    HPI Randel presents for a follow-up visit for diabetes, afib/flutter, and high cholesterol.  Diabetes -- A1c is improved to 6.8 today which is stable. Currently taking farxiga . Has not been eating as much since his wife passed away last year, she did most of the cooking.  Lower extremity edema -- mild, not severe AFIB -- on amiodarone  and eliquis  High cholesterol -- taking atorvastatin .     Current Medication: Outpatient Encounter Medications as of 03/24/2024  Medication Sig   Accu-Chek Softclix Lancets lancets Use 2x daily. Dx: E11.65.   amiodarone  (PACERONE ) 200 MG tablet Take 1 tablet (200 mg total) by mouth 2 (two) times daily.   aspirin  EC 81 MG tablet Take 1 tablet (81 mg total) by mouth daily. Swallow whole.   ASPIRIN -CAFFEINE PO Take by mouth as needed. Reported on 04/14/2016   atorvastatin  (LIPITOR) 40 MG tablet Take 1 tablet (40 mg total) by mouth daily.   Blood Glucose Monitoring Suppl (ACCU-CHEK GUIDE ME) w/Device KIT Use as directed. Dx: E11.65.   Cholecalciferol (VITAMIN D-3 PO) Take by mouth daily.   ELIQUIS  5 MG TABS tablet Take 1 tablet (5 mg total) by mouth 2 (two) times daily.   FARXIGA  10 MG TABS tablet Take 1 tablet (10 mg total) by mouth daily.   glucose blood (ACCU-CHEK GUIDE TEST) test strip Use 2x daily. Dx: E11.65.   levocetirizine (XYZAL ) 5 MG tablet Take 1 tablet (5 mg total) by mouth every evening.   nitroGLYCERIN (NITROSTAT) 0.4 MG SL tablet PLACE 1 TAB UNDER TONGUE FOR CHEST PAIN EVERY FIVE MINUTES--MAX OF 3 DOSES THEN CALL 911 OR GO TO ER   ondansetron  (ZOFRAN -ODT) 4 MG disintegrating tablet Take 1 tablet (4 mg total) by mouth every 8  (eight) hours as needed for nausea or vomiting.   oxyCODONE -acetaminophen  (PERCOCET) 5-325 MG tablet Take 1 tablet by mouth every 6 (six) hours as needed for moderate pain (pain score 4-6).   ranolazine  (RANEXA ) 500 MG 12 hr tablet Take 1 tablet (500 mg total) by mouth 2 (two) times daily.   fluticasone (FLONASE) 50 MCG/ACT nasal spray 2 sprays into each nostril two (2) times a day.   No facility-administered encounter medications on file as of 03/24/2024.    Surgical History: Past Surgical History:  Procedure Laterality Date   ABLATION     ablation of atrial fibrillation   CARDIAC CATHETERIZATION  2023   CATARACT EXTRACTION W/PHACO Left 04/14/2016   Procedure: CATARACT EXTRACTION PHACO AND INTRAOCULAR LENS PLACEMENT (IOC);  Surgeon: Billee Buddle, MD;  Location: [facility];  Service: Ophthalmology;  Laterality: Left;   COLONOSCOPY     CORONARY ANGIOPLASTY WITH STENT PLACEMENT  04/2022   HAND SURGERY Left    multiple teeth extracted      Medical History: Past Medical History:  Diagnosis Date   Arteriosclerosis of coronary artery    Arthritis    back, arms   B12 deficiency    Coronary artery disease of native artery of native heart with stable angina pectoris (HCC)    Enlarged prostate    was on Flomax but not taking it   Gastroesophageal reflux disease without esophagitis  Headache(784.0)    left ear stopped up   Hyperlipidemia    takes Trilipix daily   Low back pain    Low back pain with sciatica    Mixed hyperlipidemia    Paroxysmal atrial fibrillation (HCC)    Pneumonia    hx of;15+yrs ago   Primary hypertension    Seasonal allergies    Stage 3a chronic kidney disease (CKD) (HCC)    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  does NOT wear    Family History: Family History  Problem Relation Age of  Onset   Heart disease Mother    Heart disease Father     Social History   Socioeconomic History   Marital status: Married    Spouse name: Not on file   Number of children: Not on file   Years of education: Not on file   Highest education level: Not on file  Occupational History   Not on file  Tobacco Use   Smoking status: Former   Smokeless tobacco: Not on file   Tobacco comments:    quit smoking 57yrs ago  Vaping Use   Vaping status: Never Used  Substance and Sexual Activity   Alcohol use: No   Drug use: No   Sexual activity: Not Currently  Other Topics Concern   Not on file  Social History Narrative   Not on file   Social Drivers of Health   Financial Resource Strain: Low Risk  (02/05/2023)   Received from [facility]   Overall Financial Resource Strain (CARDIA)    Difficulty of Paying Living Expenses: Not hard at all  Food Insecurity: No Food Insecurity (02/05/2023)   Received from [facility]   Hunger Vital Sign    Worried About Running Out of Food in the Last Year: Never true    Ran Out of Food in the Last Year: Never true  Transportation Needs: No Transportation Needs (02/05/2023)   Received from [facility]   PRAPARE - Transportation    Lack of Transportation (Medical): No    Lack of Transportation (Non-Medical): No  Physical Activity: Insufficiently Active (12/31/2022)   Received from [facility]   Exercise Vital Sign    Days of Exercise per Week: 2 days    Minutes of Exercise per Session: 30 min  Stress: No Stress Concern Present (12/31/2022)   Received from [facility] - Occupational Stress Questionnaire    Feeling of Stress : Not at all  Social Connections: Socially Integrated (12/31/2022)   Received from [facility]   Social Connection and Isolation Panel [NHANES]    Frequency of Communication with Friends and Family: More than three times a week    Frequency of Social Gatherings with  Friends and Family: More than three times a week    Attends Religious Services: More than 4 times per year    Active Member of [employer] or Organizations: Yes    Attends Engineer, structural: More than 4 times per year    Marital Status: Married  Catering manager Violence: Not on file      Review of Systems  Constitutional:  Positive for fatigue. Negative for chills and unexpected weight change.  HENT:  Negative for congestion,  postnasal drip, rhinorrhea, sneezing and sore throat.   Eyes:  Negative for redness.  Respiratory:  Positive for shortness of breath. Negative for cough, chest tightness and wheezing.   Cardiovascular: Negative.  Negative for chest pain, palpitations (paroxysmal atrial flutter) and leg swelling.  Gastrointestinal: Negative.  Negative for abdominal pain, constipation, diarrhea, nausea and vomiting.  Genitourinary:  Negative for dysuria and frequency.  Musculoskeletal:  Positive for arthralgias and back pain. Negative for joint swelling and neck pain.  Skin:  Negative for rash.  Neurological:  Positive for weakness. Negative for tremors and numbness.  Hematological:  Negative for adenopathy. Does not bruise/bleed easily.  Psychiatric/Behavioral:  Positive for behavioral problems (Depression) and sleep disturbance. Negative for self-injury and suicidal ideas. The patient is not nervous/anxious.     Vital Signs: BP 109/62   Pulse (!) 55   Temp 98.4 F (36.9 C)   Resp 16   Ht 5' 8 (1.727 m)   Wt 165 lb 3.2 oz (74.9 kg)   SpO2 96%   BMI 25.12 kg/m    Physical Exam Vitals and nursing note reviewed.  Constitutional:      Appearance: Normal appearance.  HENT:     Head: Normocephalic and atraumatic.   Cardiovascular:     Rate and Rhythm: Normal rate and regular rhythm.  Pulmonary:     Effort: Pulmonary effort is normal.     Breath sounds: Normal breath sounds.   Skin:    General: Skin is warm and dry.   Neurological:     Mental Status: He is  alert.   Psychiatric:        Mood and Affect: Mood normal.        Behavior: Behavior normal.        Assessment/Plan: 1. Type 2 diabetes mellitus with stage 3a chronic kidney disease, without long-term current use of insulin (HCC) (Primary) A1c is stable at this time. Continue farxiga  as prescribed.  - POCT glycosylated hemoglobin (Hb A1C)  2. Stage 3a chronic kidney disease (CKD) (HCC) Patient is on farxiga , continue as prescribed.   3. Hypertension associated with stage 3a chronic kidney disease due to type 2 diabetes mellitus (HCC) Not currently on any BP medications, BP is stable, continue to monitor.   4. Paroxysmal atrial flutter (HCC) Continue eliquis  and amiodarone  as prescribed.   5. Senile purpura (HCC) Noted, no issues, continue to monitor   6. Bilateral lower extremity edema Mild, no issue at this time.    General Counseling: jedadiah abdallah understanding of the findings of todays visit and agrees with plan of treatment. I have discussed any further diagnostic evaluation that may be needed or ordered today. We also reviewed his medications today. he has been encouraged to call the office with any questions or concerns that should arise related to todays visit.    Orders Placed This Encounter  Procedures   POCT glycosylated hemoglobin (Hb A1C)    No orders of the defined types were placed in this encounter.   Return in about 4 months (around 07/25/2024) for F/U, Recheck A1C, Hosteen Kienast PCP.   Total time spent:30 Minutes Time spent includes review of chart, medications, test results, and follow up plan with the patient.   [place] Controlled Substance Database was reviewed by me.  This patient was seen by Laurence Pons, FNP-C in collaboration with Dr. Verneta Gone as a part of collaborative care agreement.   Loyalty Brashier R. Bobbi Burow, MSN, FNP-C Internal medicine

## 2024-03-30 ENCOUNTER — Ambulatory Visit: Payer: 59 | Attending: Cardiology | Admitting: Cardiology

## 2024-03-30 NOTE — Progress Notes (Deleted)
 Cardiology Office Note:  .   Date:  03/30/2024  ID:  Robert Elm Sr., DOB 1946-01-16, MRN [id number] PCP: Laurence Pons, NP  [facility] Providers Cardiologist:  Constancia Delton, MD { Click to update primary MD,subspecialty MD or APP then REFRESH:1}   History of Present Illness: Robert Elm Sr. is a 78 y.o. male with a past medical history of paroxysmal atrial fibrillation status post ablation (2022), hyperlipidemia, diabetes, presents today for follow-up.   Previously followed by [facility]  from the cardiac perspective and Dr. Meredeth Stallion (last visit 10/29/2023).  He had been diagnosed with atrial fibrillation approximately 2 years prior and underwent ablation procedure.  He was started on apixaban  which she has been taking since that time.  He does have been maintained on amiodarone  and digoxin.  He had started to have issues of not thinking clearly he attributed to the recent loss of his wife.  He states that he occasionally has chest discomfort and states that he underwent an ischemic evaluation last year with previous cardiologist and was advised that everything was okay.  He did state that he takes sublingual nitro and aspirin  to help with his occasional chest discomfort.  Previously seen in clinic 10/30/2023.  EKG revealed sinus bradycardia with rate of 53.  Digoxin was discontinued and amiodarone  was reduced to 200 mg daily.  He was scheduled for an updated echocardiogram at that point.  He was evaluated in the emergency department with weakness, nausea and vomiting and multiple falls.  He was severely hypotensive with a blood pressure of 79/54.  He was given fluid boluses.  Chest x-ray was negative.  CT of the head, abdomen, pelvis also done and patient managed on apixaban .  CT showed developing enteritis otherwise reassuring CT was negative for any acute abnormality.  Labs indicated dehydration likely secondary to GI loss.  He was treated with further  fluids and Zofran  for nausea and was able to be discharged.   He was last seen in clinic 12/31/2023 stating that for the cardiac perspective he had been doing well.  He denies chest pain shortness of breath or palpitations.  States that he was recently evaluated in the emergency department for gastroenteritis.  There were no medication changes that were made or further testing that was ordered at that time.  He returns to clinic today  ROS: 10 point review of systems has been reviewed and considered negative except ones been listed in the HPI  Studies Reviewed: .        2D echo 12/10/2023 1. Left ventricular ejection fraction, by estimation, is 60 to 65%. The  left ventricle has normal function. The left ventricle has no regional  wall motion abnormalities. There is mild left ventricular hypertrophy.  Left ventricular diastolic parameters  were normal.   2. Right ventricular systolic function is normal. The right ventricular  size is normal. There is normal pulmonary artery systolic pressure. The  estimated right ventricular systolic pressure is 33.1 mmHg.   3. The mitral valve is normal in structure. Mild mitral valve  regurgitation. No evidence of mitral stenosis.   4. The aortic valve is normal in structure. Aortic valve regurgitation is  mild. No aortic stenosis is present.   5. The inferior vena cava is normal in size with greater than 50%  respiratory variability, suggesting right atrial pressure of 3 mmHg.  Risk Assessment/Calculations:    CHA2DS2-VASc Score = 3  {Confirm score is correct.  If not, click  here to update score.  REFRESH note.  :1} This indicates a 3.2% annual risk of stroke. The patient's score is based upon: CHF History: 0 HTN History: 0 Diabetes History: 1 Stroke History: 0 Vascular Disease History: 0 Age Score: 2 Gender Score: 0   {This patient has a significant risk of stroke if diagnosed with atrial fibrillation.  Please consider VKA or DOAC agent for  anticoagulation if the bleeding risk is acceptable.   You can also use the SmartPhrase .[email or web address] for documentation.   :[id number]} No BP recorded.  {Refresh Note OR Click here to enter BP  :1}***       Physical Exam:   VS:  There were no vitals taken for this visit.   Wt Readings from Last 3 Encounters:  03/24/24 165 lb 3.2 oz (74.9 kg)  01/05/24 164 lb 12.8 oz (74.8 kg)  12/31/23 167 lb 12.8 oz (76.1 kg)    GEN: Well nourished, well developed in no acute distress NECK: No JVD; No carotid bruits CARDIAC: ***RRR, no murmurs, rubs, gallops RESPIRATORY:  Clear to auscultation without rales, wheezing or rhonchi  ABDOMEN: Soft, non-tender, non-distended EXTREMITIES:  No edema; No deformity   ASSESSMENT AND PLAN: .   Paroxysmal atrial fibrillation Pure  Hypercholesterolemia Diabetes with neuropathy    {Are you ordering a CV Procedure (e.g. stress test, cath, DCCV, TEE, etc)?   Press F2        :[id number]}  Dispo: ***  Signed, Juanpablo Ciresi, NP

## 2024-04-12 ENCOUNTER — Ambulatory Visit: Admitting: Cardiovascular Disease

## 2024-04-19 ENCOUNTER — Ambulatory Visit: Payer: Self-pay | Admitting: Nurse Practitioner

## 2024-04-24 ENCOUNTER — Encounter: Payer: Self-pay | Admitting: Nurse Practitioner

## 2024-05-10 ENCOUNTER — Other Ambulatory Visit: Payer: Self-pay

## 2024-05-10 DIAGNOSIS — Z79899 Other long term (current) drug therapy: Secondary | ICD-10-CM

## 2024-05-10 MED ORDER — ELIQUIS 5 MG PO TABS: 5.0000 mg | ORAL_TABLET | Freq: Two times a day (BID) | ORAL | 5 refills | Status: DC

## 2024-05-11 ENCOUNTER — Telehealth: Payer: Self-pay | Admitting: Nurse Practitioner

## 2024-05-11 NOTE — Telephone Encounter (Signed)
 Received call from [facility] regarding PCS paperwork. S/w Alyssa, she is still working on. I notified Antoinette that I will fax once completed-Toni

## 2024-05-12 ENCOUNTER — Telehealth: Payer: Self-pay | Admitting: Nurse Practitioner

## 2024-05-12 NOTE — Telephone Encounter (Signed)
 PCS completed & faxed back to [facility]; [phone]. Scanned. Notified patient-Toni

## 2024-05-24 ENCOUNTER — Other Ambulatory Visit: Payer: Self-pay | Admitting: Nurse Practitioner

## 2024-05-24 DIAGNOSIS — Z79899 Other long term (current) drug therapy: Secondary | ICD-10-CM

## 2024-05-25 ENCOUNTER — Telehealth: Payer: Self-pay | Admitting: Nurse Practitioner

## 2024-05-25 ENCOUNTER — Other Ambulatory Visit: Payer: Self-pay | Admitting: Nurse Practitioner

## 2024-05-25 DIAGNOSIS — Z79899 Other long term (current) drug therapy: Secondary | ICD-10-CM

## 2024-05-25 MED ORDER — FARXIGA 10 MG PO TABS: 10.0000 mg | ORAL_TABLET | Freq: Every day | ORAL | 5 refills | Status: DC

## 2024-05-25 MED ORDER — ATORVASTATIN CALCIUM 40 MG PO TABS: 40.0000 mg | ORAL_TABLET | Freq: Every day | ORAL | 5 refills | Status: DC

## 2024-05-25 MED ORDER — AMIODARONE HCL 200 MG PO TABS: 200.0000 mg | ORAL_TABLET | Freq: Two times a day (BID) | ORAL | 5 refills | Status: DC

## 2024-05-25 NOTE — Telephone Encounter (Signed)
 Patient never returned call regarding PCS paperwork. Put original @ front desk-Toni

## 2024-05-27 ENCOUNTER — Telehealth: Payer: Self-pay

## 2024-05-27 ENCOUNTER — Other Ambulatory Visit: Payer: Self-pay

## 2024-05-27 MED ORDER — BENZONATATE 200 MG PO CAPS: 200.0000 mg | ORAL_CAPSULE | Freq: Three times a day (TID) | ORAL | 0 refills | Status: AC | PRN

## 2024-05-27 NOTE — Telephone Encounter (Signed)
 Patient called to report cough, sent Tessalon pearls per AA. Advised patient to go to urgent care if symptoms do not improve/worsen.

## 2024-05-30 ENCOUNTER — Other Ambulatory Visit: Payer: Self-pay

## 2024-05-30 ENCOUNTER — Telehealth: Payer: Self-pay

## 2024-05-30 DIAGNOSIS — Z79899 Other long term (current) drug therapy: Secondary | ICD-10-CM

## 2024-05-30 MED ORDER — AMIODARONE HCL 200 MG PO TABS: 200.0000 mg | ORAL_TABLET | Freq: Two times a day (BID) | ORAL | 0 refills | Status: DC

## 2024-05-30 NOTE — Telephone Encounter (Signed)
 [place] phar called that pt out amiodarone  to local phar due to pt is out of his med  spoke with phar to discuss with pt which phar he is using mail order or local phar

## 2024-06-17 ENCOUNTER — Ambulatory Visit

## 2024-06-17 ENCOUNTER — Telehealth: Payer: Self-pay

## 2024-06-17 DIAGNOSIS — M2141 Flat foot [pes planus] (acquired), right foot: Secondary | ICD-10-CM

## 2024-06-17 DIAGNOSIS — Z794 Long term (current) use of insulin: Secondary | ICD-10-CM

## 2024-06-17 DIAGNOSIS — M2142 Flat foot [pes planus] (acquired), left foot: Secondary | ICD-10-CM

## 2024-06-17 DIAGNOSIS — E1142 Type 2 diabetes mellitus with diabetic polyneuropathy: Secondary | ICD-10-CM

## 2024-06-17 DIAGNOSIS — I25118 Atherosclerotic heart disease of native coronary artery with other forms of angina pectoris: Secondary | ICD-10-CM

## 2024-06-17 NOTE — Telephone Encounter (Signed)
 Patient states the shoes are floppy,too big. He doesn't like these shoes with strap and believes he received the wrong pair. Can you look into this?

## 2024-06-20 ENCOUNTER — Other Ambulatory Visit: Payer: Self-pay | Admitting: Nurse Practitioner

## 2024-06-21 NOTE — Progress Notes (Signed)

## 2024-06-22 ENCOUNTER — Ambulatory Visit: Attending: Cardiology | Admitting: Cardiology

## 2024-06-22 ENCOUNTER — Ambulatory Visit: Admitting: Nurse Practitioner

## 2024-06-22 NOTE — Progress Notes (Deleted)
 Cardiology Office Note   Date:  06/22/2024  ID:  Robert ONEIDA Candy Sr., DOB 1946-06-15, MRN [id number] PCP: Liana Fish, NP  [facility] Providers Cardiologist:  Redell Cave, MD { Click to update primary MD,subspecialty MD or APP then REFRESH:1}    History of Present Illness Robert Garcia. is a 78 y.o. male with past medical history of paroxysmal atrial fibrillation status post ablation (2020), hyperlipidemia, type 2 diabetes, presents today for follow-up.   Previously followed by Robert Garcia  from the cardiac perspective and Robert Garcia (last visit 10/29/2023).  He had been diagnosed with atrial fibrillation approximately 2 years prior and underwent ablation procedure.  He was started on apixaban  which she has been taking since that time.  He does have been maintained on amiodarone  and digoxin.  He had started to have issues of not thinking clearly he attributed to the recent loss of his wife.  He states that he occasionally has chest discomfort and states that he underwent an ischemic evaluation last year with previous cardiologist and was advised that everything was okay.  He did state that he takes sublingual nitro and aspirin  to help with his occasional chest discomfort.  He was previously seen in clinic 10/30/2023 by Robert Garcia.  At that time his EKG revealed sinus bradycardia with rate of 53.  His digoxin was discontinued and amiodarone  was reduced to 200 mg daily.  He was scheduled for an updated echocardiogram.  He was then evaluated in the emergency department at home with weakness, nausea, vomiting, and falls.  He was found to be hypotensive with a blood pressure of 79/54 given 500 mL of normal saline with recheck blood pressure systolic 118.  Chest x-ray was negative.  CT of the head, abdomen, pelvis was also done due to patient being on apixaban .  CT showed developing enteritis otherwise reassuring and negative for acute abnormality.  He was given  another liter of IV fluids as well as Zofran  for nausea and was considered stable for discharge.   He was last seen in clinic 12/31/2023 stating he had been doing well from a cardiac perspective.  He was continued on apixaban  200 milligrams daily and apixaban  5 mg twice daily for CHA2DS2-VASc of at least 3.  He had previously been advised to stop aspirin  but he has yet to stop it and have been advised if he was going to continue on aspirin  to reduce his dose from full-strength aspirin  81 mg daily to reduce the risk of bleeding.  There were no other medication changes that were made and further testing that was ordered at that time.  He returns to clinic today  ROS: 10 point review of system has been reviewed and considered negative except ones were listed in the HPI  Studies Reviewed      2D echo 12/10/2023 1. Left ventricular ejection fraction, by estimation, is 60 to 65%. The  left ventricle has normal function. The left ventricle has no regional  wall motion abnormalities. There is mild left ventricular hypertrophy.  Left ventricular diastolic parameters  were normal.   2. Right ventricular systolic function is normal. The right ventricular  size is normal. There is normal pulmonary artery systolic pressure. The  estimated right ventricular systolic pressure is 33.1 mmHg.   3. The mitral valve is normal in structure. Mild mitral valve  regurgitation. No evidence of mitral stenosis.   4. The aortic valve is normal in structure. Aortic valve regurgitation is  mild. No aortic stenosis  is present.   5. The inferior vena cava is normal in size with greater than 50%  respiratory variability, suggesting right atrial pressure of 3 mmHg.  Risk Assessment/Calculations  CHA2DS2-VASc Score = 3  {Confirm score is correct.  If not, click here to update score.  REFRESH note.  :1} This indicates a 3.2% annual risk of stroke. The patient's score is based upon: CHF History: 0 HTN History: 0 Diabetes  History: 1 Stroke History: 0 Vascular Disease History: 0 Age Score: 2 Gender Score: 0   {This patient has a significant risk of stroke if diagnosed with atrial fibrillation.  Please consider VKA or DOAC agent for anticoagulation if the bleeding risk is acceptable.   You can also use the SmartPhrase .[email or web address] for documentation.   :[id number]} No BP recorded.  {Refresh Note OR Click here to enter BP  :1}***       Physical Exam VS:  There were no vitals taken for this visit.       Wt Readings from Last 3 Encounters:  03/24/24 165 lb 3.2 oz (74.9 kg)  01/05/24 164 lb 12.8 oz (74.8 kg)  12/31/23 167 lb 12.8 oz (76.1 kg)    GEN: Well nourished, well developed in no acute distress NECK: No JVD; No carotid bruits CARDIAC: ***RRR, no murmurs, rubs, gallops RESPIRATORY:  Clear to auscultation without rales, wheezing or rhonchi  ABDOMEN: Soft, non-tender, non-distended EXTREMITIES:  No edema; No deformity   ASSESSMENT AND PLAN Paroxysmal atrial fibrillation Pure hypercholesterolemia Type 2 diabetes with nephropathy     {Are you ordering a CV Procedure (e.g. stress test, cath, DCCV, TEE, etc)?   Press F2        :[id number]}  Dispo: ***  Signed, Giara Mcgaughey, NP

## 2024-06-23 ENCOUNTER — Telehealth: Payer: Self-pay

## 2024-06-23 ENCOUNTER — Ambulatory Visit: Admitting: Nurse Practitioner

## 2024-06-23 ENCOUNTER — Encounter: Payer: Self-pay | Admitting: Nurse Practitioner

## 2024-06-23 VITALS — BP 90/52 | HR 53 | Temp 96.9°F | Resp 16 | Ht 68.0 in | Wt 167.6 lb

## 2024-06-23 DIAGNOSIS — N1831 Chronic kidney disease, stage 3a: Secondary | ICD-10-CM

## 2024-06-23 DIAGNOSIS — R2689 Other abnormalities of gait and mobility: Secondary | ICD-10-CM | POA: Diagnosis not present

## 2024-06-23 DIAGNOSIS — E1122 Type 2 diabetes mellitus with diabetic chronic kidney disease: Secondary | ICD-10-CM

## 2024-06-23 DIAGNOSIS — D649 Anemia, unspecified: Secondary | ICD-10-CM | POA: Diagnosis not present

## 2024-06-23 NOTE — Telephone Encounter (Signed)
 Patient showed up in [facility] office with diabetic shoes stating they are not the ones he ordered. I let pt know I was sending them to [place] the orthotic department will contact him with a follow up. Shoes given to Dr Tobie

## 2024-06-23 NOTE — Progress Notes (Signed)
 [facility] [address] [place], [place] [address]  Internal MEDICINE  Office Visit Note  Patient Name: Robert Garcia  [id number]  [id number]  Date of Service: 07/27/2024  Chief Complaint  Patient presents with   Acute Visit     HPI Robert Garcia presents for an acute sick visit for impaired balance and gait.  --gradual impaired balance and feeling wobbley History of mild anemia and diabetes  Moving to [place] at the end of the month.      Current Medication:  Outpatient Encounter Medications as of 06/23/2024  Medication Sig   Accu-Chek Softclix Lancets lancets Use 2x daily. Dx: E11.65.   amiodarone  (PACERONE ) 200 MG tablet Take 1 tablet (200 mg total) by mouth 2 (two) times daily.   aspirin  EC 81 MG tablet Take 1 tablet (81 mg total) by mouth daily. Swallow whole.   ASPIRIN -CAFFEINE PO Take by mouth as needed. Reported on 04/14/2016   atorvastatin  (LIPITOR) 40 MG tablet TAKE ONE TABLET (40 MG TOTAL) BY MOUTH DAILY.   benzonatate  (TESSALON ) 200 MG capsule Take 1 capsule (200 mg total) by mouth 3 (three) times daily as needed for cough.   Blood Glucose Monitoring Suppl (ACCU-CHEK GUIDE ME) w/Device KIT Use as directed. Dx: E11.65.   Cholecalciferol (VITAMIN D-3 PO) Take by mouth daily.   ELIQUIS  5 MG TABS tablet Take 1 tablet (5 mg total) by mouth 2 (two) times daily.   FARXIGA  10 MG TABS tablet Take 1 tablet (10 mg total) by mouth daily.   fluticasone (FLONASE) 50 MCG/ACT nasal spray 2 sprays into each nostril two (2) times a day.   glucose blood (ACCU-CHEK GUIDE TEST) test strip Use 2x daily. Dx: E11.65.   levocetirizine (XYZAL ) 5 MG tablet Take 1 tablet (5 mg total) by mouth every evening.   nitroGLYCERIN (NITROSTAT) 0.4 MG SL tablet PLACE 1 TAB UNDER TONGUE FOR CHEST PAIN EVERY FIVE MINUTES--MAX OF 3 DOSES THEN CALL 911 OR GO TO ER   ondansetron  (ZOFRAN -ODT) 4 MG disintegrating tablet Take 1 tablet (4 mg total) by mouth every 8 (eight) hours as needed for  nausea or vomiting.   oxyCODONE -acetaminophen  (PERCOCET) 5-325 MG tablet Take 1 tablet by mouth every 6 (six) hours as needed for moderate pain (pain score 4-6).   [DISCONTINUED] ranolazine  (RANEXA ) 500 MG 12 hr tablet Take 1 tablet (500 mg total) by mouth 2 (two) times daily.   No facility-administered encounter medications on file as of 06/23/2024.      Medical History: Past Medical History:  Diagnosis Date   Arteriosclerosis of coronary artery    Arthritis    back, arms   B12 deficiency    Coronary artery disease of native artery of native heart with stable angina pectoris (HCC)    Enlarged prostate    was on Flomax but not taking it   Gastroesophageal reflux disease without esophagitis    Headache(784.0)    left ear stopped up   Hyperlipidemia    takes Trilipix daily   Low back pain    Low back pain with sciatica    Mixed hyperlipidemia    Paroxysmal atrial fibrillation (HCC)    Pneumonia    hx of;15+yrs ago   Primary hypertension    Seasonal allergies    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  does NOT wear     Vital Signs: BP (!) 90/52   Pulse (!) 53   Temp (!) 96.9 F (36.1 C)   Resp 16   Ht 5' 8 (1.727 m)   Wt 167 lb 9.6 oz (76 kg)   SpO2 95%   BMI 25.48 kg/m    Review of Systems  Constitutional:  Positive for fatigue. Negative for chills and unexpected weight change.  HENT:  Negative for congestion, postnasal drip, rhinorrhea, sneezing and sore throat.   Eyes:  Negative for redness.  Respiratory:  Positive for shortness of breath. Negative for cough, chest tightness and wheezing.   Cardiovascular: Negative.  Negative for chest pain, palpitations (paroxysmal atrial flutter) and leg swelling.  Gastrointestinal: Negative.  Negative for abdominal pain,  constipation, diarrhea, nausea and vomiting.  Genitourinary:  Negative for dysuria and frequency.  Musculoskeletal:  Positive for arthralgias and back pain. Negative for joint swelling and neck pain.  Skin:  Negative for rash.  Neurological:  Positive for weakness. Negative for tremors and numbness.  Hematological:  Negative for adenopathy. Does not bruise/bleed easily.  Psychiatric/Behavioral:  Positive for behavioral problems (Depression) and sleep disturbance. Negative for self-injury and suicidal ideas. The patient is not nervous/anxious.     Physical Exam Vitals and nursing note reviewed.  Constitutional:      Appearance: Normal appearance.  HENT:     Head: Normocephalic and atraumatic.  Cardiovascular:     Rate and Rhythm: Normal rate and regular rhythm.  Pulmonary:     Effort: Pulmonary effort is normal.     Breath sounds: Normal breath sounds.  Skin:    General: Skin is warm and dry.  Neurological:     Mental Status: He is alert.  Psychiatric:        Mood and Affect: Mood normal.        Behavior: Behavior normal.       Assessment/Plan: 1. Type 2 diabetes mellitus with stage 3a chronic kidney disease, without long-term current use of insulin (HCC) (Primary) Routine labs ordered  - Hgb A1C w/o eAG  2. Anemia, unspecified type Routine labs ordered  - CBC with Differential/Platelet - B12 and Folate Panel - Iron, TIBC and Ferritin Panel  3. Impaired gait and mobility Recommended a neurology consult but patient wants to wait until he moves and find a neurologist near where he will be living in [place].   4. Balance problem Recommended a neurology consult but patient wants to wait until he moves and find a neurologist near where he will be living in [place].    General Counseling: Robert Garcia understanding of the findings of todays visit and agrees with plan of treatment. I have discussed any further diagnostic evaluation that may be needed or ordered today.  We also reviewed his medications today. he has been encouraged to call the office with any questions or concerns that should arise related to todays visit.    Counseling:    Orders Placed This Encounter  Procedures   CBC with Differential/Platelet   B12 and Folate Panel   Iron, TIBC and Ferritin Panel   Hgb A1C w/o eAG    No orders of the defined types were placed in this encounter.   Return for no follow up, he is moving out of town and will find a new PCP.  [place] Controlled Substance Database was reviewed by me for overdose risk score (ORS)  Time spent:30 Minutes  Time spent with patient included reviewing progress notes, labs, imaging studies, and discussing plan for follow up.   This patient was seen by Mardy Maxin, FNP-C in collaboration with Dr. Sigrid Bathe as a part of collaborative care agreement.  Esabella Stockinger R. Maxin, MSN, FNP-C Internal Medicine

## 2024-06-24 ENCOUNTER — Telehealth: Payer: Self-pay

## 2024-06-24 NOTE — Telephone Encounter (Signed)
 Left 2nd VM for patient RE: shoes he will need an appt to return shoes but has already dropped them off in [place] office. However will need to pick something else out Insurance has already been billed. Appt needed unless he goes on line to website to choose another option I left him the website to visit  [email or web address]  Lolita Schultze CPed, CFo, CFm

## 2024-06-29 ENCOUNTER — Other Ambulatory Visit: Payer: Self-pay | Admitting: Nurse Practitioner

## 2024-06-29 DIAGNOSIS — Z79899 Other long term (current) drug therapy: Secondary | ICD-10-CM

## 2024-07-25 ENCOUNTER — Ambulatory Visit: Admitting: Nurse Practitioner

## 2024-07-27 ENCOUNTER — Encounter: Payer: Self-pay | Admitting: Nurse Practitioner

## 2024-10-20 ENCOUNTER — Encounter: Payer: Self-pay | Admitting: Nurse Practitioner

## 2024-10-20 ENCOUNTER — Ambulatory Visit: Admitting: Nurse Practitioner

## 2024-10-20 VITALS — BP 122/81 | HR 68 | Temp 95.7°F | Resp 16 | Ht 68.0 in | Wt 161.2 lb

## 2024-10-20 DIAGNOSIS — E1122 Type 2 diabetes mellitus with diabetic chronic kidney disease: Secondary | ICD-10-CM | POA: Diagnosis not present

## 2024-10-20 DIAGNOSIS — M25511 Pain in right shoulder: Secondary | ICD-10-CM

## 2024-10-20 DIAGNOSIS — N1831 Chronic kidney disease, stage 3a: Secondary | ICD-10-CM

## 2024-10-20 DIAGNOSIS — I509 Heart failure, unspecified: Secondary | ICD-10-CM | POA: Diagnosis not present

## 2024-10-20 LAB — HEMOGLOBIN A1C: Hemoglobin A1C: 6.1

## 2024-10-20 LAB — POCT CBG (FASTING - GLUCOSE)-MANUAL ENTRY: Glucose Fasting, POC: 140 mg/dL — AB (ref 70–99)

## 2024-10-20 MED ORDER — TRAMADOL HCL 50 MG PO TABS: 50.0000 mg | ORAL_TABLET | Freq: Four times a day (QID) | ORAL | 0 refills | Status: AC | PRN

## 2024-10-20 NOTE — Progress Notes (Signed)
 [facility] [address] [place], [place] [address]  Internal MEDICINE  Office Visit Note  Patient Name: Robert Garcia  [id number]  [id number]  Date of Service: 10/20/2024  Chief Complaint  Patient presents with   Acute Visit    Right shoulder pain     HPI Robert Garcia presents for an acute sick visit for right shoulder pain  --onset of right shoulder pain was about 1 week ago.  He had bursitis in the right shoulder a while ago and was seen by orthopedic surgery at that time.  Random glucose 140 Needs to get rescheduled with his cardiologist since he moved back  He moved back to the area because he wants to stay close to where his wife was buried when she passed.  He is in need of some routine health maintenance and his AWV is due in late February next year.    Current Medication:  Outpatient Encounter Medications as of 10/20/2024  Medication Sig   traMADol (ULTRAM) 50 MG tablet Take 1 tablet (50 mg total) by mouth every 6 (six) hours as needed for up to 5 days for moderate pain (pain score 4-6) or severe pain (pain score 7-10).   Accu-Chek Softclix Lancets lancets Use 2x daily. Dx: E11.65.   amiodarone  (PACERONE ) 200 MG tablet Take 1 tablet (200 mg total) by mouth 2 (two) times daily.   aspirin  EC 81 MG tablet Take 1 tablet (81 mg total) by mouth daily. Swallow whole.   ASPIRIN -CAFFEINE PO Take by mouth as needed. Reported on 04/14/2016   atorvastatin  (LIPITOR) 40 MG tablet TAKE ONE TABLET (40 MG TOTAL) BY MOUTH DAILY.   benzonatate  (TESSALON ) 200 MG capsule Take 1 capsule (200 mg total) by mouth 3 (three) times daily as needed for cough.   Blood Glucose Monitoring Suppl (ACCU-CHEK GUIDE ME) w/Device KIT Use as directed. Dx: E11.65.   Cholecalciferol (VITAMIN D-3 PO) Take by mouth daily.   ELIQUIS  5 MG TABS tablet Take 1 tablet (5 mg total) by mouth 2 (two) times daily.   FARXIGA  10 MG TABS tablet Take 1 tablet (10 mg total) by mouth daily.   fluticasone  (FLONASE) 50 MCG/ACT nasal spray 2 sprays into each nostril two (2) times a day.   glucose blood (ACCU-CHEK GUIDE TEST) test strip Use 2x daily. Dx: E11.65.   levocetirizine (XYZAL ) 5 MG tablet Take 1 tablet (5 mg total) by mouth every evening.   nitroGLYCERIN (NITROSTAT) 0.4 MG SL tablet PLACE 1 TAB UNDER TONGUE FOR CHEST PAIN EVERY FIVE MINUTES--MAX OF 3 DOSES THEN CALL 911 OR GO TO ER   ondansetron  (ZOFRAN -ODT) 4 MG disintegrating tablet Take 1 tablet (4 mg total) by mouth every 8 (eight) hours as needed for nausea or vomiting.   oxyCODONE -acetaminophen  (PERCOCET) 5-325 MG tablet Take 1 tablet by mouth every 6 (six) hours as needed for moderate pain (pain score 4-6).   ranolazine  (RANEXA ) 500 MG 12 hr tablet TAKE ONE TABLET (500 MG TOTAL) BY MOUTH TWO (TWO) TIMES DAILY.   No facility-administered encounter medications on file as of 10/20/2024.      Medical History: Past Medical History:  Diagnosis Date   Arteriosclerosis of coronary artery    Arthritis    back, arms   B12 deficiency    Coronary artery disease of native artery of native heart with stable angina pectoris    Enlarged prostate    was on Flomax but not taking it   Gastroesophageal reflux disease without esophagitis  Headache(784.0)    left ear stopped up   Hyperlipidemia    takes Trilipix daily   Low back pain    Low back pain with sciatica    Mixed hyperlipidemia    Paroxysmal atrial fibrillation (HCC)    Pneumonia    hx of;15+yrs ago   Primary hypertension    Seasonal allergies    Stage 3a chronic kidney disease (CKD) (HCC)    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  does NOT wear     Vital Signs: BP 122/81   Pulse 68   Temp (!) 95.7 F (35.4 C)   Resp 16   Ht 5' 8 (1.727 m)   Wt 161 lb 3.2 oz (73.1 kg)   SpO2 97%   BMI 24.51 kg/m    Review of  Systems  Constitutional:  Positive for fatigue. Negative for chills and unexpected weight change.  HENT:  Negative for congestion, postnasal drip, rhinorrhea, sneezing and sore throat.   Eyes:  Negative for redness.  Respiratory:  Positive for shortness of breath. Negative for cough, chest tightness and wheezing.   Cardiovascular: Negative.  Negative for chest pain, palpitations (paroxysmal atrial flutter) and leg swelling.  Gastrointestinal: Negative.  Negative for abdominal pain, constipation, diarrhea, nausea and vomiting.  Genitourinary:  Negative for dysuria and frequency.  Musculoskeletal:  Positive for arthralgias (right shoulder) and back pain. Negative for joint swelling and neck pain.  Skin:  Negative for rash.  Neurological:  Positive for weakness. Negative for tremors and numbness.  Hematological:  Negative for adenopathy. Does not bruise/bleed easily.  Psychiatric/Behavioral:  Positive for behavioral problems (Depression) and sleep disturbance. Negative for self-injury and suicidal ideas. The patient is not nervous/anxious.     Physical Exam Vitals and nursing note reviewed.  Constitutional:      General: He is not in acute distress.    Appearance: Normal appearance. He is normal weight. He is not ill-appearing.  HENT:     Head: Normocephalic and atraumatic.  Cardiovascular:     Rate and Rhythm: Normal rate and regular rhythm.  Pulmonary:     Effort: Pulmonary effort is normal.     Breath sounds: Normal breath sounds.  Skin:    General: Skin is warm and dry.  Neurological:     Mental Status: He is alert and oriented to person, place, and time.  Psychiatric:        Mood and Affect: Mood normal.        Behavior: Behavior normal.       Assessment/Plan: 1. Acute pain of right shoulder (Primary) Referred to orthopedic surgery at [facility] and tramadol prescribed as needed for pain.  - Ambulatory referral to Orthopedic Surgery - traMADol (ULTRAM) 50 MG tablet; Take  1 tablet (50 mg total) by mouth every 6 (six) hours as needed for up to 5 days for moderate pain (pain score 4-6) or severe pain (pain score 7-10).  Dispense: 20 tablet; Refill: 0  2. Type 2 diabetes mellitus with stage 3a chronic kidney disease, without long-term current use of insulin (HCC) Random glucose was 140. Last A1c was stable at 6.1. urine sent for ACR. Continue medications as prescribed.  - POCT CBG (Fasting - Glucose) - Urine Microalbumin w/creat. ratio - Hemoglobin A1C w/out eAG  3. Heart failure, unspecified  HF chronicity, unspecified heart failure type [facility]) Noted, patient given phone number to call his cardiologist to schedule a follow up visit, last visit was in February this year.    General Counseling: zaeden lastinger understanding of the findings of todays visit and agrees with plan of treatment. I have discussed any further diagnostic evaluation that may be needed or ordered today. We also reviewed his medications today. he has been encouraged to call the office with any questions or concerns that should arise related to todays visit.    Counseling:    Orders Placed This Encounter  Procedures   Urine Microalbumin w/creat. ratio   Hemoglobin A1C w/out eAG   Ambulatory referral to Orthopedic Surgery   POCT CBG (Fasting - Glucose)    Meds ordered this encounter  Medications   traMADol (ULTRAM) 50 MG tablet    Sig: Take 1 tablet (50 mg total) by mouth every 6 (six) hours as needed for up to 5 days for moderate pain (pain score 4-6) or severe pain (pain score 7-10).    Dispense:  20 tablet    Refill:  0    Fill new script today    Return if symptoms worsen or fail to improve, for patient needs to be rescheduled for his AWV in late february. .  [place] Controlled Substance Database was reviewed by me for overdose risk score (ORS)  Time spent:30 Minutes Time spent with patient included reviewing progress notes, labs, imaging studies, and discussing plan for  follow up.   This patient was seen by Mardy Maxin, FNP-C in collaboration with Dr. Sigrid Bathe as a part of collaborative care agreement.  Erykah Lippert R. Maxin, MSN, FNP-C Internal Medicine

## 2024-10-21 ENCOUNTER — Telehealth: Payer: Self-pay | Admitting: Nurse Practitioner

## 2024-10-21 NOTE — Telephone Encounter (Addendum)
 Urgent Orthopedic referral sent via [facility] to Dr. Krasinski w/ [facility].  Called to notify patient. No answer. MB full-[facility]   Orthopedic appointment 10/24/2024 with [facility]

## 2024-10-22 LAB — MICROALBUMIN / CREATININE URINE RATIO
Creatinine, Urine: 79.8 mg/dL
Microalb/Creat Ratio: 36 mg/g{creat} — ABNORMAL HIGH (ref 0–29)
Microalbumin, Urine: 28.5 ug/mL

## 2024-10-27 ENCOUNTER — Telehealth: Payer: Self-pay | Admitting: Nurse Practitioner

## 2024-10-27 NOTE — Telephone Encounter (Signed)
 Patient wanted to know if he could take his Tramadol  with all his other medicine, patent was notified that he could.

## 2024-11-04 ENCOUNTER — Other Ambulatory Visit: Payer: Self-pay | Admitting: Nurse Practitioner

## 2024-11-04 DIAGNOSIS — Z79899 Other long term (current) drug therapy: Secondary | ICD-10-CM

## 2024-12-06 ENCOUNTER — Ambulatory Visit: Admitting: Nurse Practitioner

## 2024-12-08 ENCOUNTER — Ambulatory Visit: Admitting: Nurse Practitioner

## 2024-12-14 ENCOUNTER — Encounter: Payer: Self-pay | Admitting: Nurse Practitioner

## 2024-12-14 ENCOUNTER — Ambulatory Visit (INDEPENDENT_AMBULATORY_CARE_PROVIDER_SITE_OTHER): Admitting: Nurse Practitioner

## 2024-12-14 ENCOUNTER — Telehealth: Payer: Self-pay | Admitting: Podiatry

## 2024-12-14 VITALS — BP 124/71 | HR 82 | Temp 95.0°F | Resp 16 | Ht 68.0 in | Wt 163.2 lb

## 2024-12-14 DIAGNOSIS — R296 Repeated falls: Secondary | ICD-10-CM

## 2024-12-14 DIAGNOSIS — E1122 Type 2 diabetes mellitus with diabetic chronic kidney disease: Secondary | ICD-10-CM

## 2024-12-14 DIAGNOSIS — R2689 Other abnormalities of gait and mobility: Secondary | ICD-10-CM | POA: Diagnosis not present

## 2024-12-14 DIAGNOSIS — D692 Other nonthrombocytopenic purpura: Secondary | ICD-10-CM

## 2024-12-14 DIAGNOSIS — I4892 Unspecified atrial flutter: Secondary | ICD-10-CM | POA: Diagnosis not present

## 2024-12-14 DIAGNOSIS — I129 Hypertensive chronic kidney disease with stage 1 through stage 4 chronic kidney disease, or unspecified chronic kidney disease: Secondary | ICD-10-CM

## 2024-12-14 DIAGNOSIS — N1831 Chronic kidney disease, stage 3a: Secondary | ICD-10-CM

## 2024-12-14 DIAGNOSIS — R300 Dysuria: Secondary | ICD-10-CM | POA: Diagnosis not present

## 2024-12-14 DIAGNOSIS — R531 Weakness: Secondary | ICD-10-CM | POA: Diagnosis not present

## 2024-12-14 DIAGNOSIS — R748 Abnormal levels of other serum enzymes: Secondary | ICD-10-CM | POA: Diagnosis not present

## 2024-12-14 LAB — GLUCOSE, POCT (MANUAL RESULT ENTRY): POC Glucose: 262 mg/dL — AB (ref 70–99)

## 2024-12-14 MED ORDER — DEXCOM G7 SENSOR MISC: 11 refills | Status: AC

## 2024-12-14 MED ORDER — DEXCOM G7 RECEIVER DEVI: 11 refills | Status: AC

## 2024-12-14 NOTE — Telephone Encounter (Signed)
 This patient brought his diabetic shoes back months ago. Looks like Trish tried to call him with no response. The patient showed up in [facility] wanting to get a pair of shoes because his insurance was already billed but he has no shoes. Patient would like a call back on what he can do. [phone].

## 2024-12-14 NOTE — Progress Notes (Cosign Needed)
 [facility] [address] [place], [place] [address]  Internal MEDICINE  Office Visit Note  Patient Name: Robert Garcia  [id number]  [id number]  Date of Service: 12/14/2024  Chief Complaint  Patient presents with   Diabetes   Gastroesophageal Reflux   Hypertension   Hyperlipidemia   Follow-up    HPI Robert Garcia presents for a follow-up visit for diabetes, impaired gait, needs help at home, multiple falls, hypertension.  Diabetes -- wants dexcom sensor, has trouble checking glucose at home via fingerstick.  Hypertension -- controlled on current medications Atrial flutter -- on anti arrhythmia medications, followed by cardiology High cholesterol --taking atorvastatin  daily.  Spot on back of left leg -- nothing there.  Multiple falls, uses cane, stumbles a lot when standing and walking.  Needs assistance with ADLs and medication management.  He has impaired balance, mobility and gait.     Current Medication: Outpatient Encounter Medications as of 12/14/2024  Medication Sig   Continuous Glucose Receiver (DEXCOM G7 RECEIVER) DEVI Use one device with the sensors to check glucose for diabetes   [DISCONTINUED] Continuous Glucose Sensor (DEXCOM G7 SENSOR) MISC Apply 1 sensor every 10 days to monitor blood glucose   Accu-Chek Softclix Lancets lancets USE TO CHECK BLOOD SUGAR TYWICE DAILY DX: E11.65.   amiodarone  (PACERONE ) 200 MG tablet TAKE 1 TABLET (200 MG TOTAL) BY MOUTH 2 TIMES DAILY.   aspirin  EC 81 MG tablet Take 1 tablet (81 mg total) by mouth daily. Swallow whole.   ASPIRIN -CAFFEINE PO Take by mouth as needed. Reported on 04/14/2016   atorvastatin  (LIPITOR) 40 MG tablet TAKE ONE TABLET (40 MG TOTAL) BY MOUTH DAILY.   benzonatate  (TESSALON ) 200 MG capsule Take 1 capsule (200 mg total) by mouth 3 (three) times daily as needed for cough.   Blood Glucose Monitoring Suppl (ACCU-CHEK GUIDE ME) w/Device KIT Use as directed. Dx: E11.65.   Cholecalciferol (VITAMIN D-3 PO)  Take by mouth daily.   Continuous Glucose Sensor (DEXCOM G7 SENSOR) MISC Apply 1 sensor every 10 days to monitor blood glucose   dofetilide (TIKOSYN) 250 MCG capsule Take 250 mcg by mouth 2 (two) times daily.   ELIQUIS  5 MG TABS tablet TAKE 1 TABLET (5 MG TOTAL) BY MOUTH 2 (TWO) TIMES DAILY.   FARXIGA  10 MG TABS tablet TAKE 1 TABLET (10 MG TOTAL) BY MOUTH DAILY.   fluticasone (FLONASE) 50 MCG/ACT nasal spray 2 sprays into each nostril two (2) times a day.   glucose blood (ACCU-CHEK GUIDE TEST) test strip USE TO CHECK BLOOD SUGAR TWICE DAILY DX: E11.65.   levocetirizine (XYZAL ) 5 MG tablet TAKE 1 TABLET (5 MG TOTAL) BY MOUTH EVERY EVENING.   midodrine (PROAMATINE) 10 MG tablet Take 10 mg by mouth 3 (three) times daily.   nitroGLYCERIN (NITROSTAT) 0.4 MG SL tablet PLACE 1 TAB UNDER TONGUE FOR CHEST PAIN EVERY FIVE MINUTES--MAX OF 3 DOSES THEN CALL 911 OR GO TO ER   ondansetron  (ZOFRAN -ODT) 4 MG disintegrating tablet Take 1 tablet (4 mg total) by mouth every 8 (eight) hours as needed for nausea or vomiting.   ranolazine  (RANEXA ) 500 MG 12 hr tablet TAKE 1 TABLET (500 MG TOTAL) BY MOUTH 2 (TWO) TIMES DAILY.   [DISCONTINUED] oxyCODONE -acetaminophen  (PERCOCET) 5-325 MG tablet Take 1 tablet by mouth every 6 (six) hours as needed for moderate pain (pain score 4-6).   No facility-administered encounter medications on file as of 12/14/2024.    Surgical History: Past Surgical History:  Procedure Laterality Date  ABLATION     ablation of atrial fibrillation   CARDIAC CATHETERIZATION  2023   CATARACT EXTRACTION W/PHACO Left 04/14/2016   Procedure: CATARACT EXTRACTION PHACO AND INTRAOCULAR LENS PLACEMENT (IOC);  Surgeon: Donzell Arlyce Budd, MD;  Location: [facility];  Service: Ophthalmology;  Laterality: Left;   COLONOSCOPY     CORONARY ANGIOPLASTY WITH STENT PLACEMENT  04/2022   HAND SURGERY Left    multiple teeth extracted      Medical History: Past Medical History:  Diagnosis Date    Arteriosclerosis of coronary artery    Arthritis    back, arms   B12 deficiency    Coronary artery disease of native artery of native heart with stable angina pectoris    Enlarged prostate    was on Flomax but not taking it   Gastroesophageal reflux disease without esophagitis    Headache(784.0)    left ear stopped up   Hyperlipidemia    takes Trilipix daily   Low back pain    Low back pain with sciatica    Mixed hyperlipidemia    Paroxysmal atrial fibrillation (HCC)    Pneumonia    hx of;15+yrs ago   Primary hypertension    Seasonal allergies    Stage 3a chronic kidney disease (CKD) (HCC)    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  does NOT wear    Family History: Family History  Problem Relation Age of Onset   Heart disease Mother    Heart disease Father     Social History   Socioeconomic History   Marital status: Married    Spouse name: Not on file   Number of children: Not on file   Years of education: Not on file   Highest education level: Not on file  Occupational History   Not on file  Tobacco Use   Smoking status: Former   Smokeless tobacco: Not on file   Tobacco comments:    quit smoking 66yrs ago  Vaping Use   Vaping status: Never Used  Substance and Sexual Activity   Alcohol use: No   Drug use: No   Sexual activity: Not Currently  Other Topics Concern   Not on file  Social History Narrative   Not on file   Social Drivers of Health   Tobacco Use: Medium Risk (12/14/2024)   Patient History    Smoking Tobacco Use: Former    Smokeless Tobacco Use: Unknown    Passive Exposure: Not on file  Financial Resource Strain: Low Risk (08/28/2024)   Received from [facility]   Overall Financial Resource Strain (CARDIA)    How hard is it for you to pay for the very basics like food, housing, medical care,  and heating?: Not very hard  Food Insecurity: No Food Insecurity (08/28/2024)   Received from [facility]   Epic    Within the past 12 months, you worried that your food would run out before you got the money to buy more.: Never true    Within the past 12 months, the food you bought just didn't last and you didn't have money to get more.: Never true  Transportation Needs: No Transportation Needs (08/28/2024)   Received from [facility] - Transportation    Lack of Transportation (  Medical): No    Lack of Transportation (Non-Medical): No  Physical Activity: Insufficiently Active (08/27/2024)   Received from [facility]   Exercise Vital Sign    On average, how many days per week do you engage in moderate to strenuous exercise (like a brisk walk)?: 2 days    On average, how many minutes do you engage in exercise at this level?: 30 min  Stress: Stress Concern Present (08/27/2024)   Received from [facility] of Occupational Health - Occupational Stress Questionnaire    Do you feel stress - tense, restless, nervous, or anxious, or unable to sleep at night because your mind is troubled all the time - these days?: Very much  Social Connections: Moderately Integrated (08/27/2024)   Received from [facility]   Social Connection and Isolation Panel    In a typical week, how many times do you talk on the phone with family, friends, or neighbors?: More than three times a week    How often do you get together with friends or relatives?: More than three times a week    How often do you attend church or religious services?: More than 4 times per year    Do you belong to any clubs or organizations such as church groups, unions, fraternal or athletic groups, or school groups?: Yes    How often do you attend meetings of the clubs or organizations you belong to?: More than 4 times per year    Are you married, widowed, divorced, separated, never married, or  living with a partner?: Widowed  Intimate Partner Violence: Not At Risk (08/27/2024)   Received from [facility]   Epic    Within the last year, have you been afraid of your partner or ex-partner?: No    Within the last year, have you been humiliated or emotionally abused in other ways by your partner or ex-partner?: No    Within the last year, have you been kicked, hit, slapped, or otherwise physically hurt by your partner or ex-partner?: No    Within the last year, have you been raped or forced to have any kind of sexual activity by your partner or ex-partner?: No  Depression (PHQ2-9): High Risk (01/05/2024)   Depression (PHQ2-9)    PHQ-2 Score: 14  Alcohol Screen: Not on file  Housing: Not on file  Utilities: Low Risk (08/28/2024)   Received from [facility]   Utilities    Within the past 12 months, have you been unable to get utilities(heat, electricity) when it was really needed?: No  Health Literacy: Medium Risk (08/27/2024)   Received from [facility] Literacy    How often do you need to have someone help you when you read instructions, pamphlets, or other written material from your doctor or pharmacy?: Sometimes      Review of Systems  Constitutional:  Positive for fatigue. Negative for chills and unexpected weight change.  HENT:  Negative for congestion, postnasal drip, rhinorrhea, sneezing and sore throat.   Eyes:  Negative for redness.  Respiratory:  Positive for shortness of breath. Negative for cough, chest tightness and wheezing.   Cardiovascular: Negative.  Negative for chest pain, palpitations (paroxysmal atrial flutter) and leg swelling.  Gastrointestinal: Negative.  Negative for abdominal pain, constipation, diarrhea, nausea and vomiting.  Genitourinary:  Negative for dysuria and frequency.  Musculoskeletal:  Positive for arthralgias and back pain. Negative for joint swelling and neck pain.  Skin:  Negative for rash.  Neurological:  Positive  for weakness. Negative for tremors and numbness.  Hematological:  Negative for adenopathy. Does not bruise/bleed easily.  Psychiatric/Behavioral:  Positive for behavioral problems (Depression) and sleep disturbance. Negative for self-injury and suicidal ideas. The patient is not nervous/anxious.     Vital Signs: BP 124/71   Pulse 82   Temp (!) 95 F (35 C)   Resp 16   Ht 5' 8 (1.727 m)   Wt 163 lb 3.2 oz (74 kg)   SpO2 97%   BMI 24.81 kg/m    Physical Exam Vitals and nursing note reviewed.  Constitutional:      Appearance: Normal appearance.  HENT:     Head: Normocephalic and atraumatic.  Cardiovascular:     Rate and Rhythm: Normal rate and regular rhythm.  Pulmonary:     Effort: Pulmonary effort is normal.     Breath sounds: Normal breath sounds.  Skin:    General: Skin is warm and dry.  Neurological:     Mental Status: He is alert.  Psychiatric:        Mood and Affect: Mood normal.        Behavior: Behavior normal.        Assessment/Plan: 1. Type 2 diabetes mellitus with stage 3a chronic kidney disease, without long-term current use of insulin (HCC) (Primary) Home health ordered with nursing, physical therapy, social work and home health aide. Urine sent for ACR. Random glucose reading checked in office per patient request and was >200. Dexcom sensor ordered and dexcom receiver ordered.  - Ambulatory referral to Home Health - Urine Microalbumin w/creat. ratio - POCT Glucose (CBG) - Continuous Glucose Sensor (DEXCOM G7 SENSOR) MISC; Apply 1 sensor every 10 days to monitor blood glucose  Dispense: 3 each; Refill: 11  - Continuous Glucose Receiver (DEXCOM G7 RECEIVER) DEVI; Use one device with the sensors to check glucose for diabetes  Dispense: 1 each; Refill: 11  2. Hypertension associated with stage 3a chronic kidney disease due to type 2 diabetes mellitus (HCC) Home health ordered with nursing, physical therapy, social work and home health aide. Needs help with  medication management - Ambulatory referral to Home Health  3. Paroxysmal atrial flutter (HCC) Home health ordered with nursing, physical therapy, social work and home health aide. Needs help with medication management and ADLS - Ambulatory referral to Home Health  4. Stage 3a chronic kidney disease (CKD) (HCC) Home health ordered with nursing, physical therapy, social work and home health aide. Needs help with medication management and ADLS - Ambulatory referral to Home Health  5. Weakness generalized Home health ordered with nursing, physical therapy, social work and home health aide. Needs help with medication management and ADLS and is at increased risk of falling  - Ambulatory referral to Home Health  6. Impaired gait and mobility Home health ordered with nursing, physical therapy, social work and home health aide. Needs help with medication management and ADLS and is at increased risk of falling  - Ambulatory referral to Home Health  7. Senile purpura Home health ordered with nursing, physical therapy, social work and home health aide. Needs help with medication management and ADLS and is at increased risk of falling  - Ambulatory referral to Home Health  8. Elevated liver enzymes Repeat lab ordered  - CMP14+EGFR  9. Dysuria Urine sent to lab - UA/M w/rflx Culture, Routine  10. Frequent falls Multiple falls due to unsteady gait, needs home health  General Counseling: rayshaun needle understanding of the findings of todays visit and agrees with plan of treatment. I have discussed any further diagnostic evaluation that may be needed or ordered today. We also reviewed his medications today. he has been encouraged to call the office with any questions or concerns that should arise related to todays visit.    Orders Placed This Encounter  Procedures   CMP14+EGFR   UA/M w/rflx Culture, Routine   Urine Microalbumin w/creat. ratio   Ambulatory referral to Home Health    POCT Glucose (CBG)    Meds ordered this encounter  Medications   Continuous Glucose Sensor (DEXCOM G7 SENSOR) MISC    Sig: Apply 1 sensor every 10 days to monitor blood glucose    Dispense:  3 each    Refill:  11    Dx code E11.69. please fill new script asap   Continuous Glucose Receiver (DEXCOM G7 RECEIVER) DEVI    Sig: Use one device with the sensors to check glucose for diabetes    Dispense:  1 each    Refill:  11    Dx code E11.65    Return in about 1 month (around 01/11/2025) for F/U, Yaw Escoto PCP.   Total time spent:30 Minutes Time spent includes review of chart, medications, test results, and follow up plan with the patient.   [place] Controlled Substance Database was reviewed by me.  This patient was seen by Mardy Maxin, FNP-C in collaboration with Dr. Sigrid Bathe as a part of collaborative care agreement.   [place] Marney R. Maxin, MSN, FNP-C Internal medicine

## 2024-12-15 ENCOUNTER — Telehealth: Payer: Self-pay

## 2024-12-15 LAB — UA/M W/RFLX CULTURE, ROUTINE
Bilirubin, UA: NEGATIVE
Ketones, UA: NEGATIVE
Leukocytes,UA: NEGATIVE
Nitrite, UA: POSITIVE — AB
Protein,UA: NEGATIVE
RBC, UA: NEGATIVE
Urobilinogen, Ur: 1 mg/dL (ref 0.2–1.0)
pH, UA: 5 (ref 5.0–7.5)

## 2024-12-15 LAB — MICROALBUMIN / CREATININE URINE RATIO
Creatinine, Urine: 53.8 mg/dL
Microalb/Creat Ratio: 14 mg/g{creat} (ref 0–29)
Microalbumin, Urine: 7.3 ug/mL

## 2024-12-15 LAB — MICROSCOPIC EXAMINATION
Casts: NONE SEEN /LPF
Epithelial Cells (non renal): NONE SEEN /HPF (ref 0–10)
RBC, Urine: NONE SEEN /HPF (ref 0–2)

## 2024-12-15 LAB — URINE CULTURE, REFLEX

## 2024-12-15 NOTE — Telephone Encounter (Signed)
 Send home health aide referral to [facility] and also [facility] will take referral for home health

## 2024-12-15 NOTE — Telephone Encounter (Signed)
 Send message to [facility] for home health waiting for response

## 2024-12-15 NOTE — Addendum Note (Signed)
 Addended by: Jannelle Notaro on: 12/15/2024 08:14 AM   Modules accepted: Orders

## 2024-12-16 NOTE — Telephone Encounter (Signed)
 I tried to call Robert Garcia to let him know we are working on a resolution to this issue. He didn't answer and mail box is full. I couldn't leave him a message.

## 2024-12-22 ENCOUNTER — Ambulatory Visit: Admitting: Cardiology

## 2025-01-05 ENCOUNTER — Ambulatory Visit: Payer: 59 | Admitting: Nurse Practitioner

## 2025-01-06 ENCOUNTER — Ambulatory Visit: Admitting: Nurse Practitioner
